# Patient Record
Sex: Male | Born: 1945 | Race: Black or African American | Hispanic: No | Smoking: Former smoker
Health system: Southern US, Community
[De-identification: ages and names within clinical notes are randomized; demographics above are authoritative.]

## PROBLEM LIST (undated history)

## (undated) DIAGNOSIS — J45909 Unspecified asthma, uncomplicated: Secondary | ICD-10-CM

## (undated) DIAGNOSIS — K219 Gastro-esophageal reflux disease without esophagitis: Secondary | ICD-10-CM

## (undated) DIAGNOSIS — I219 Acute myocardial infarction, unspecified: Secondary | ICD-10-CM

## (undated) DIAGNOSIS — I2510 Atherosclerotic heart disease of native coronary artery without angina pectoris: Secondary | ICD-10-CM

## (undated) DIAGNOSIS — E7800 Pure hypercholesterolemia, unspecified: Secondary | ICD-10-CM

## (undated) DIAGNOSIS — I10 Essential (primary) hypertension: Secondary | ICD-10-CM

## (undated) HISTORY — DX: Acute myocardial infarction, unspecified: I21.9

## (undated) HISTORY — DX: Essential (primary) hypertension: I10

## (undated) HISTORY — PX: OTHER SURGICAL HISTORY: SHX169

## (undated) HISTORY — DX: Gastro-esophageal reflux disease without esophagitis: K21.9

## (undated) HISTORY — DX: Pure hypercholesterolemia, unspecified: E78.00

## (undated) HISTORY — PX: BUNIONECTOMY: SHX129

## (undated) HISTORY — PX: HAMMER TOE SURGERY: SHX385

## (undated) HISTORY — PX: CARDIAC CATHETERIZATION: SHX172

---

## 2004-08-10 DIAGNOSIS — I219 Acute myocardial infarction, unspecified: Secondary | ICD-10-CM

## 2004-08-10 HISTORY — PX: CORONARY ARTERY BYPASS GRAFT: SHX141

## 2004-08-10 HISTORY — DX: Acute myocardial infarction, unspecified: I21.9

## 2008-08-10 HISTORY — PX: COLONOSCOPY: SHX174

## 2014-03-10 HISTORY — PX: ESOPHAGOGASTRODUODENOSCOPY: SHX1529

## 2014-03-10 HISTORY — PX: COLONOSCOPY: SHX174

## 2015-04-22 ENCOUNTER — Encounter: Payer: Self-pay | Admitting: Gastroenterology

## 2015-05-08 ENCOUNTER — Ambulatory Visit (INDEPENDENT_AMBULATORY_CARE_PROVIDER_SITE_OTHER): Payer: Medicare FFS | Admitting: Gastroenterology

## 2015-05-08 ENCOUNTER — Encounter: Payer: Self-pay | Admitting: Gastroenterology

## 2015-05-08 VITALS — BP 140/86 | HR 71 | Temp 97.7°F | Ht 70.0 in | Wt 183.0 lb

## 2015-05-08 DIAGNOSIS — R1032 Left lower quadrant pain: Secondary | ICD-10-CM | POA: Insufficient documentation

## 2015-05-08 NOTE — Patient Instructions (Signed)
Let's go ahead and do a CT scan, since you have been dealing with this for so long.   Please have blood work done as well.   Start taking supplemental fiber such as Metamucil or Benefiber daily as directed on bottle. You may add a probiotic daily such as Align, [place], Digestive Advantage, [employer].   We will provide further recommendations after CT is completed.

## 2015-05-08 NOTE — Progress Notes (Signed)
Primary Care Physician:  Talmage Coin, MD Primary Gastroenterologist:  Dr. Oneida Alar   Chief Complaint  Patient presents with  . Diverticulitis    HPI:   Harry Haynes is a 69 y.o. male presenting today at the request of his PCP secondary to concern for diverticulitis. He has had a colonoscopy in 2010 with tubular adenoma and again in 2015 by Dr. West Carbo without "significant diverticulosis". It is unclear if any diverticula were noted on either colonoscopy. He has had several episodes of what was assumed to be diverticulitis and treated empirically but without imaging.   States he was told he had diverticulitis in 2004. Treated recently for assumed diverticulitis. States first episode in 2004 he had eaten spinach salad, developed diarrhea and abdominal pain. Occasional flares every 6-7 months. This year, worse and more frequent. Every 60 days has "flares". Will have a lot of gas, go into diarrhea, feels bloated, stomach starts burning/hurting, pain in left-side, feels weak. Will last for about a week but then goes on medication. Improves symptoms. No fever or chills with episodes. Has been on Cipro and Flagyl in the past but last time was on Augmentin. No CT scans performed. No unexplained weight loss. Within past 6 months has had a decrease in appetite. Right now, on a scale of 2-3 with pain. Can't eat raw veggies, acidic fruit. States he has more burning underlying lower abdomen. No N/V, no GERD exacerbation. Heavy starchy foods will make lower abdomen hurt worse. Diet is mainly fish and chicken. Stays away from red meat. Over last 8-9 months has had more diarrhea/soft stool. Has BM about every 2-4 times per day. Postprandial. Usually Bristol scale # 3. Diarrhea flares up to a #7 during painful episodes. No rectal bleeding. When on antibiotics, stool is dark. Stool feels productive. Occasional bloating. Has underlying lower abdominal discomfort chronically.   Past Medical History  Diagnosis  Date  . Hypertension   . Hypercholesterolemia   . GERD (gastroesophageal reflux disease)   . MI (myocardial infarction) 2006    Past Surgical History  Procedure Laterality Date  . Colonoscopy  Aug 2015    Dr. West Carbo: normal without polyps. No significant diverticulosis  . Esophagogastroduodenoscopy  Aug 2015    Dr. West Carbo: 7cm hiatal hernia, lower esophageal ring s/p 67 Maloney   . Colonoscopy  2010    Dr. Algis Greenhouse: 7-8 mm polyp, tubular adenoma  . Right inguinal hernia  1980s  . 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 Bunionectomy    . Hammer toe surgery      Current Outpatient Prescriptions  Medication Sig Dispense Refill  . aspirin 81 MG tablet Take 81 mg by mouth daily.    Marland Kitchen esomeprazole (NEXIUM) 40 MG capsule Take 40 mg by mouth daily at 12 noon.    . fluticasone (FLOVENT HFA) 110 MCG/ACT inhaler Inhale 2 puffs into the lungs 2 (two) times daily.    . hydrochlorothiazide (HYDRODIURIL) 25 MG tablet Take 25 mg by mouth daily.    Marland Kitchen lisinopril (PRINIVIL,ZESTRIL) 20 MG tablet Take 20 mg by mouth daily.    Marland Kitchen loratadine (CLARITIN) 10 MG tablet Take 10 mg by mouth daily.    . metoprolol tartrate (LOPRESSOR) 12.5 mg TABS tablet Take 12.5 mg by mouth 2 (two) times daily.    . montelukast (SINGULAIR) 10 MG tablet Take 10 mg by mouth at bedtime.    . rosuvastatin (CRESTOR) 20 MG tablet Take 20 mg by  mouth daily.     No current facility-administered medications for this visit.    Allergies as of 05/08/2015  . (No Known Allergies)    Family History  Problem Relation Age of Onset  . Colon cancer Brother     diagnosed at 60, deceased  . Lung cancer Brother     Social History   Social History  . Marital Status: Unknown    Spouse Name: N/A  . Number of Children: N/A  . Years of Education: N/A   Occupational History  . Not on file.   Social History Main Topics  . Smoking status: Former Smoker    Quit date: 05/08/1971  . Smokeless tobacco: Not on  file  . Alcohol Use: No  . Drug Use: No  . Sexual Activity: Not on file   Other Topics Concern  . Not on file   Social History Narrative  . No narrative on file    Review of Systems: As mentioned in HPI   Physical Exam: BP 140/86 mmHg  Pulse 71  Temp(Src) 97.7 F (36.5 C)  Ht 5\' 10"  (1.778 m)  Wt 183 lb (83.008 kg)  BMI 26.26 kg/m2 General:   Alert and oriented. Pleasant and cooperative. Well-nourished and well-developed.  Head:  Normocephalic and atraumatic. Eyes:  Without icterus, sclera clear and conjunctiva pink.  Ears:  Normal auditory acuity. Nose:  No deformity, discharge,  or lesions. Mouth:  No deformity or lesions, oral mucosa pink.  Neck:  Supple, without mass or thyromegaly. Lungs:  Clear to auscultation bilaterally. No wheezes, rales, or rhonchi. No distress.  Heart:  S1, S2 present without murmurs appreciated.  Abdomen:  +BS, soft, very mild discomfort LLQ and non-distended. No HSM noted. No guarding or rebound. No masses appreciated.  Rectal:  Deferred  Msk:  Symmetrical without gross deformities. Normal posture. Extremities:  Without  edema. Neurologic:  Alert and  oriented x4;  grossly normal neurologically. Psych:  Alert and cooperative. Normal mood and affect.  Recent labs July 2016:  Normal lipid profile, Hgb 14.8,

## 2015-05-09 LAB — BASIC METABOLIC PANEL
BUN: 19 mg/dL (ref 7–25)
CHLORIDE: 104 mmol/L (ref 98–110)
CO2: 27 mmol/L (ref 20–31)
Calcium: 9.7 mg/dL (ref 8.6–10.3)
Creat: 1.33 mg/dL — ABNORMAL HIGH (ref 0.70–1.18)
GLUCOSE: 89 mg/dL (ref 65–99)
POTASSIUM: 4.5 mmol/L (ref 3.5–5.3)
SODIUM: 140 mmol/L (ref 135–146)

## 2015-05-09 LAB — TISSUE TRANSGLUTAMINASE, IGA: TISSUE TRANSGLUTAMINASE AB, IGA: 1 U/mL (ref <4)

## 2015-05-10 LAB — IGA: IGA: 166 mg/dL (ref 68–379)

## 2015-05-13 NOTE — Assessment & Plan Note (Signed)
69 year old male treated for presumed diverticulitis on multiple prior occasions, with persisting underlying vague LLQ pain. Appears to have "flare" of pain with associated diarrhea. Notes softer stool over past 8-9 months but not consistent with diarrhea. Occasional bloating. Last colonoscopy fairly recent by Dr. West Carbo in Aug 2015 with "no significant diverticulosis". Prior to this colonoscopy in 2010 with tubular adenoma. No abdominal imaging on file. Symptoms vague, and I'm not convinced he is dealing with repetitive bouts of diverticulitis. Although he has postprandial stool, it is soft and does not appear to be consistent with IBS-D. Will add fiber, check celiac labs, add probiotic, pursue CT now. Further recommendations to follow.

## 2015-05-13 NOTE — Progress Notes (Signed)
cc'ed to pcp °

## 2015-05-20 ENCOUNTER — Ambulatory Visit (HOSPITAL_COMMUNITY)
Admission: RE | Admit: 2015-05-20 | Discharge: 2015-05-20 | Disposition: A | Discharge status: Home / Self Care | Payer: Medicare FFS | Source: Ambulatory Visit | Attending: Gastroenterology | Admitting: Gastroenterology

## 2015-05-20 DIAGNOSIS — M5136 Other intervertebral disc degeneration, lumbar region: Secondary | ICD-10-CM | POA: Insufficient documentation

## 2015-05-20 DIAGNOSIS — R197 Diarrhea, unspecified: Secondary | ICD-10-CM | POA: Diagnosis not present

## 2015-05-20 DIAGNOSIS — R1032 Left lower quadrant pain: Secondary | ICD-10-CM

## 2015-05-20 DIAGNOSIS — N281 Cyst of kidney, acquired: Secondary | ICD-10-CM | POA: Diagnosis not present

## 2015-05-20 MED ORDER — IOHEXOL 300 MG/ML  SOLN
100.0000 mL | Freq: Once | INTRAMUSCULAR | Status: AC | PRN
  Administered 2015-05-20: 100 mL via INTRAVENOUS

## 2015-05-26 NOTE — Progress Notes (Signed)
Quick Note:  CT and labs reviewed. No evidence of diverticulitis or malignancy. Small renal cysts. Prostate is prominent.   1. Labs to include celiac were negative. Cr was mildly elevated at 1.33, but I don't know his baseline. Please send labs to PCP.  2. How is he doing since adding fiber, probiotic? Still with pain? ______

## 2015-05-28 NOTE — Progress Notes (Signed)
Quick Note:  Pt is aware. Michela Pitcher he is doing much better since he is using the fiber and probiotic. His pain has decreased to on a scale of 1-2 now. Routing to Vicente Males to [place] and to Manuela Schwartz to send labs to his PCP. ______

## 2015-06-05 NOTE — Progress Notes (Signed)
Quick Note:  Pt is aware and is scheduled OV with AS on 07/25/2015 at 9:30 AM. ______

## 2015-06-05 NOTE — Progress Notes (Signed)
Quick Note:  Please have patient follow-up with me in about 6-8 weeks. ______

## 2015-06-26 ENCOUNTER — Encounter: Payer: Self-pay | Admitting: Gastroenterology

## 2015-07-25 ENCOUNTER — Ambulatory Visit: Payer: Medicare FFS | Admitting: Gastroenterology

## 2015-07-29 ENCOUNTER — Ambulatory Visit (INDEPENDENT_AMBULATORY_CARE_PROVIDER_SITE_OTHER): Payer: Medicare FFS | Admitting: Gastroenterology

## 2015-07-29 ENCOUNTER — Encounter: Payer: Self-pay | Admitting: Gastroenterology

## 2015-07-29 VITALS — BP 136/79 | HR 58 | Temp 97.5°F | Ht 71.0 in | Wt 186.4 lb

## 2015-07-29 DIAGNOSIS — R1032 Left lower quadrant pain: Secondary | ICD-10-CM

## 2015-07-29 DIAGNOSIS — K219 Gastro-esophageal reflux disease without esophagitis: Secondary | ICD-10-CM | POA: Diagnosis not present

## 2015-07-29 MED ORDER — PANTOPRAZOLE SODIUM 40 MG PO TBEC: 40.0000 mg | DELAYED_RELEASE_TABLET | Freq: Every day | ORAL | Status: DC

## 2015-07-29 NOTE — Assessment & Plan Note (Signed)
Intermittent breakthrough despite Nexium once daily, which he has been on chronically. No alarm features. EGD on file recently at outside facility. Trial Protonix. Call in 10-14 days with progress report; otherwise, return in 3 months.

## 2015-07-29 NOTE — Progress Notes (Signed)
Referring Provider: Talmage Coin, MD Primary Care Physician:  Talmage Coin, MD Primary GI: Dr. Oneida Alar   Chief Complaint  Patient presents with  . Follow-up    HPI:   Harry Haynes is a 69 y.o. male presenting today with a history of abdominal pain and treated empirically for diverticulitis in the past without imaging. Noted lower abdominal burning at last visit, with softer stool and some diarrhea over last 8-9 months. BM usually 2-4 times per day. No rectal bleeding. Remote history of tubular adenoma in 2010 by Dr. West Carbo and most recent colonoscopy 2015 "without significant diverticulosis" per reports. Fiber added at last appt, celiac serologies negative. Probiotic added as well. CT completed without any concerning findings.   Has noted significant improvement with fiber and probiotic. States he had a reaction from the flu shot in Nov 2016. Felt like he had to lay low for a few weeks. Late November his chest started hurting and wouldn't go away. Went to the ED that evening, kept overnight, sent to cardiologist and had stress test and EKG. No significant changes from 2014. States he was told stress may be contributing and started on Zoloft. Notes bloating. No abdominal pain. Stool formation much better. Will have a BM about 3 times a day. Not diarrhea. Well formed. At baseline. States his acid reflux keeps flaring up. Taking generic Nexium daily. No dysphagia. States a small amount of nausea with Zoloft. Taking Zoloft early in the morning. Has only taken Nexium for GERD symptoms.   Can't eat chocolate, coffee, acid types of food. Only complaint now is bloating.   Past Medical History  Diagnosis Date  . Hypertension   . Hypercholesterolemia   . GERD (gastroesophageal reflux disease)   . MI (myocardial infarction) ([place]) 2006    Past Surgical History  Procedure Laterality Date  . Colonoscopy  Aug 2015    Dr. West Carbo: normal without polyps. No significant diverticulosis  .  Esophagogastroduodenoscopy  Aug 2015    Dr. West Carbo: 7cm hiatal hernia, lower esophageal ring s/p 40 Maloney   . Colonoscopy  2010    Dr. Algis Greenhouse: 7-8 mm polyp, tubular adenoma  . Right inguinal hernia  1980s  . 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 Bunionectomy    . Hammer toe surgery      Current Outpatient Prescriptions  Medication Sig Dispense Refill  . aspirin 81 MG tablet Take 81 mg by mouth daily.    Marland Kitchen esomeprazole (NEXIUM) 40 MG capsule Take 40 mg by mouth daily at 12 noon.    . fluticasone (FLOVENT HFA) 110 MCG/ACT inhaler Inhale 2 puffs into the lungs 2 (two) times daily.    . hydrochlorothiazide (HYDRODIURIL) 25 MG tablet Take 25 mg by mouth daily.    Marland Kitchen lisinopril (PRINIVIL,ZESTRIL) 20 MG tablet Take 20 mg by mouth daily.    Marland Kitchen loratadine (CLARITIN) 10 MG tablet Take 10 mg by mouth daily.    . metoprolol tartrate (LOPRESSOR) 12.5 mg TABS tablet Take 12.5 mg by mouth 2 (two) times daily.    . montelukast (SINGULAIR) 10 MG tablet Take 10 mg by mouth at bedtime.    . Probiotic Product (ALIGN PO) Take by mouth daily.    . psyllium (METAMUCIL) 58.6 % powder Take 1 packet by mouth 3 (three) times daily.    . rosuvastatin (CRESTOR) 20 MG tablet Take 20 mg by mouth daily.    . sertraline (ZOLOFT) 100 MG tablet Take  50 mg by mouth daily.     No current facility-administered medications for this visit.    Allergies as of 07/29/2015  . (No Known Allergies)    Family History  Problem Relation Age of Onset  . Colon cancer Brother     diagnosed at 61, deceased  . Lung cancer Brother     Social History   Social History  . Marital Status: Unknown    Spouse Name: N/A  . Number of Children: N/A  . Years of Education: N/A   Social History Main Topics  . Smoking status: Former Smoker    Quit date: 05/08/1971  . Smokeless tobacco: None  . Alcohol Use: No  . Drug Use: No  . Sexual Activity: Not Asked   Other Topics Concern  . None   Social History  Narrative    Review of Systems: As mentioned in HPI.   Physical Exam: BP 136/79 mmHg  Pulse 58  Temp(Src) 97.5 F (36.4 C)  Ht 5\' 11"  (1.803 m)  Wt 186 lb 6.4 oz (84.55 kg)  BMI 26.01 kg/m2 General:   Alert and oriented. No distress noted. Pleasant and cooperative.  Head:  Normocephalic and atraumatic. Eyes:  Conjuctiva clear without scleral icterus. Mouth:  Oral mucosa pink and moist. Good dentition. No lesions. Abdomen:  +BS, soft, non-tender and non-distended. No rebound or guarding. No HSM or masses noted. Msk:  Symmetrical without gross deformities. Normal posture. Extremities:  Without edema. Neurologic:  Alert and  oriented x4;  grossly normal neurologically. Psych:  Alert and cooperative. Normal mood and affect.

## 2015-07-29 NOTE — Patient Instructions (Signed)
Stop Nexium. Start Protonix once each morning, 30 minutes before breakfast.   Continue fiber and probiotic.   Call me in 10-14 days with an update. If you still have bloating, we will proceed with the breath test.   3 months.

## 2015-07-29 NOTE — Assessment & Plan Note (Signed)
CT on file without acute findings. Prominent prostate and renal cysts noted. Upcoming appt with PCP in January 2017. Symptoms improved significantly with addition of probiotic, benefiber. Colonoscopy up-to-date as of last year. Continue with probiotic and benefiber. Vague abdominal bloating noted. Celiac serologies negative. Will trial different PPI as outlined in GERD. If persistent vague bloating, consider hydrogen breath test.

## 2015-07-29 NOTE — Progress Notes (Signed)
cc'ed to pcp °

## 2015-10-28 ENCOUNTER — Ambulatory Visit (INDEPENDENT_AMBULATORY_CARE_PROVIDER_SITE_OTHER): Payer: Medicare FFS | Admitting: Gastroenterology

## 2015-10-28 ENCOUNTER — Encounter: Payer: Self-pay | Admitting: Gastroenterology

## 2015-10-28 ENCOUNTER — Telehealth: Payer: Self-pay | Admitting: Gastroenterology

## 2015-10-28 VITALS — BP 127/75 | HR 68 | Temp 98.3°F | Ht 70.0 in | Wt 194.0 lb

## 2015-10-28 DIAGNOSIS — K219 Gastro-esophageal reflux disease without esophagitis: Secondary | ICD-10-CM | POA: Diagnosis not present

## 2015-10-28 DIAGNOSIS — R1032 Left lower quadrant pain: Secondary | ICD-10-CM | POA: Diagnosis not present

## 2015-10-28 DIAGNOSIS — Z800 Family history of malignant neoplasm of digestive organs: Secondary | ICD-10-CM

## 2015-10-28 NOTE — Assessment & Plan Note (Signed)
Much improved with changing from Nexium to Protonix once daily. Continue this. EGD on file. Return in 6-8 months. No concerning upper GI symptoms.

## 2015-10-28 NOTE — Assessment & Plan Note (Signed)
Surveillance colonoscopy in 2020. Will check ifobt at next visit.

## 2015-10-28 NOTE — Patient Instructions (Signed)
Continue Protonix once daily and Align daily. I have provided coupons for Align.  If you have any concerning symptoms such as recurrent abdominal pain, nausea, vomiting, weight loss, or rectal bleeding, please call me.  I will see you in 6-8 months to make sure you are doing well.  We will check a stool sample at that time. This is purely a screening for occult blood in your stool. If it is positive, we would likely proceed with an early interval colonoscopy due to your history of polyps and family history of colon cancer.   Have a great time until I see you again!

## 2015-10-28 NOTE — Telephone Encounter (Signed)
Doris:   So sorry, I forgot to ask patient: did he see his PCP in Jan 2017? He had a prominent prostate on CT and I wanted to make sure this was addressed.

## 2015-10-28 NOTE — Progress Notes (Signed)
CC'D TO PCP °

## 2015-10-28 NOTE — Progress Notes (Signed)
Referring Provider: Talmage Coin, MD Primary Care Physician:  Talmage Coin, MD  Primary GI: Dr. Oneida Alar   Chief Complaint  Patient presents with  . Follow-up    HPI:   Harry Haynes is a 70 y.o. male presenting today with a history of abdominal pain and treated empirically for diverticulitis in the past without imaging. Noted lower abdominal burning Sept 2016 with softer stool and some diarrhea over last 8-9 months. BM usually 2-4 times per day. No rectal bleeding. Remote history of tubular adenoma in 2010 by Dr. West Carbo and most recent colonoscopy 2015 "without significant diverticulosis" per reports. Fiber added in Sept 2016, celiac serologies negative. Probiotic added as well. CT completed without any concerning findings. Noted significant improvement with fiber and probiotic. Complained of vague bloating at last appt. I had him stop Nexium and start Protonix once each morning, continue fiber and probiotic. Consideration for HBT raised.   No further abdominal discomfort or bloating. No rectal bleeding. Weight is stable/actually increased. No N/V. No constipation or diarrhea. Asthma is going crazy now. States GI symptoms are good but breathing is not as good. Pulmonologist: Dr. Koleen Nimrod.    Past Medical History  Diagnosis Date  . Hypertension   . Hypercholesterolemia   . GERD (gastroesophageal reflux disease)   . MI (myocardial infarction) ([place]) 2006    Past Surgical History  Procedure Laterality Date  . Colonoscopy  Aug 2015    Dr. West Carbo: normal without polyps. No significant diverticulosis  . Esophagogastroduodenoscopy  Aug 2015    Dr. West Carbo: 7cm hiatal hernia, lower esophageal ring s/p 53 Maloney   . Colonoscopy  2010    Dr. Algis Greenhouse: 7-8 mm polyp, tubular adenoma  . Right inguinal hernia  1980s  . 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 Bunionectomy    . Hammer toe surgery      Current Outpatient Prescriptions  Medication Sig Dispense  Refill  . aspirin 81 MG tablet Take 81 mg by mouth daily.    . fluticasone (FLOVENT HFA) 110 MCG/ACT inhaler Inhale 2 puffs into the lungs 2 (two) times daily.    . hydrochlorothiazide (HYDRODIURIL) 25 MG tablet Take 25 mg by mouth daily.    Marland Kitchen lisinopril (PRINIVIL,ZESTRIL) 20 MG tablet Take 20 mg by mouth daily.    . metoprolol tartrate (LOPRESSOR) 12.5 mg TABS tablet Take 12.5 mg by mouth 2 (two) times daily.    . montelukast (SINGULAIR) 10 MG tablet Take 10 mg by mouth at bedtime.    . Probiotic Product (ALIGN PO) Take by mouth daily.    . psyllium (METAMUCIL) 58.6 % powder Take 1 packet by mouth 3 (three) times daily.    . rosuvastatin (CRESTOR) 20 MG tablet Take 20 mg by mouth daily.    . sertraline (ZOLOFT) 100 MG tablet Take 50 mg by mouth daily.    Marland Kitchen esomeprazole (NEXIUM) 40 MG capsule Take 40 mg by mouth daily at 12 noon. Reported on 10/28/2015    . loratadine (CLARITIN) 10 MG tablet Take 10 mg by mouth daily. Reported on 10/28/2015    . pantoprazole (PROTONIX) 40 MG tablet Take 1 tablet (40 mg total) by mouth daily. Take each morning 30 minutes before breakfast. (Patient not taking: Reported on 10/28/2015) 90 tablet 3   No current facility-administered medications for this visit.    Allergies as of 10/28/2015  . (No Known Allergies)    Family History  Problem Relation Age of  Onset  . Colon cancer Brother     diagnosed at 62, deceased  . Lung cancer Brother     Social History   Social History  . Marital Status: Unknown    Spouse Name: N/A  . Number of Children: N/A  . Years of Education: N/A   Social History Main Topics  . Smoking status: Former Smoker    Quit date: 05/08/1971  . Smokeless tobacco: None  . Alcohol Use: No  . Drug Use: No  . Sexual Activity: Not Asked   Other Topics Concern  . None   Social History Narrative    Review of Systems: Negative unless mentioned in HPI   Physical Exam: BP 127/75 mmHg  Pulse 68  Temp(Src) 98.3 F (36.8 C) (Oral)   Ht 5\' 10"  (1.778 m)  Wt 194 lb (87.998 kg)  BMI 27.84 kg/m2 General:   Alert and oriented. No distress noted. Pleasant and cooperative.  Head:  Normocephalic and atraumatic. Eyes:  Conjuctiva clear without scleral icterus. Mouth:  Oral mucosa pink and moist. Good dentition. No lesions. Heart:  S1, S2 present without murmurs Abdomen:  +BS, soft, non-tender and non-distended. No rebound or guarding. No HSM or masses noted. Msk:  Symmetrical without gross deformities. Normal posture. Extremities:  Without edema. Neurologic:  Alert and  oriented x4;  grossly normal neurologically. Psych:  Alert and cooperative. Normal mood and affect.

## 2015-10-28 NOTE — Assessment & Plan Note (Signed)
CT on file without acute findings but prominent prostate noted: this was copied to PCP. He has had complete resolution of abdominal discomfort, bloating, and has normal BMs daily with fiber and Align daily. Colonoscopy up-to-date as of 2015. Will have him return in 6-8 months and check an ifobt then. He has a personal history of colon polyps and positive family history of colon cancer in his brother. No concerning lower GI symptoms at this time and doing quite well.

## 2015-10-28 NOTE — Telephone Encounter (Signed)
PT said he did see his PCP in Jan 2017. Nothing was said about the prostate. He said he has another appt in a couple of months and he wlll mention it to his PCP at that time.

## 2016-04-29 ENCOUNTER — Ambulatory Visit (INDEPENDENT_AMBULATORY_CARE_PROVIDER_SITE_OTHER): Payer: Medicare FFS | Admitting: Gastroenterology

## 2016-04-29 ENCOUNTER — Encounter: Payer: Self-pay | Admitting: Gastroenterology

## 2016-04-29 VITALS — BP 140/89 | HR 74 | Temp 98.0°F | Ht 71.0 in | Wt 187.0 lb

## 2016-04-29 DIAGNOSIS — R1032 Left lower quadrant pain: Secondary | ICD-10-CM | POA: Diagnosis not present

## 2016-04-29 NOTE — Assessment & Plan Note (Signed)
Resolved with taking probiotic and Metamucil daily. CT on file without acute findings. Prominent prostate noted but PSA normal. PCP aware per patient. No concerning lower or upper GI findings. Last colonoscopy in 2015. Personal history of colon polyps and positive family history of colon cancer in brother. Check ifobt now. If positive, proceed with early interval colonoscopy. If negative, return in 1 year.

## 2016-04-29 NOTE — Progress Notes (Signed)
cc'ed to pcp °

## 2016-04-29 NOTE — Progress Notes (Signed)
Referring Provider: Talmage Coin, MD Primary Care Physician:  Talmage Coin, MD  Primary GI: Dr. Oneida Alar   Chief Complaint  Patient presents with  . Follow-up    HPI:   Harry Haynes is a 70 y.o. male presenting today with a history of history of abdominal pain and treated empirically for diverticulitis in the past without imaging. Noted lower abdominal burning Sept 2016 with softer stool and some diarrhea over last 8-9 months. BM usually 2-4 times per day. No rectal bleeding. Remote history of tubular adenoma in 2010 by Dr. West Carbo and most recent colonoscopy 2015 "without significant diverticulosis" per reports. Fiber added in Sept 2016, celiac serologies negative. Probiotic added as well. CT completed without any concerning findings but prominent prostate noted: this was copied to PCP. States his PCP is aware. Outside PSA normal. Noted significant improvement with fiber and probiotic. Complained of vague bloating at last appt. I had him stop Nexium and start Protonix once each morning, continue fiber and probiotic. Consideration for HBT raised. At last appt in March 2017 symptoms were resolved. He was doing well. He is due for routine ifobt now. He has a personal history of colon polyps and positive family history of colon cancer in his brother.   Denies abdominal pain, constipation, diarrhea, rectal bleeding. States he has a good appetite. No issues with GERD. Taking a probiotic and Metamucil. Vague abdominal bloating that is rare and mild. Overall feels really good.   Outside labs brought with patient and listed below.     Past Medical History:  Diagnosis Date  . GERD (gastroesophageal reflux disease)   . Hypercholesterolemia   . Hypertension   . MI (myocardial infarction) ([place]) 2006    Past Surgical History:  Procedure Laterality Date  . BUNIONECTOMY    . COLONOSCOPY  Aug 2015   Dr. West Carbo: normal without polyps. No significant diverticulosis  . COLONOSCOPY  2010     Dr. Algis Greenhouse: 7-8 mm polyp, tubular adenoma  . 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 ESOPHAGOGASTRODUODENOSCOPY  Aug 2015   Dr. West Carbo: 7cm hiatal hernia, lower esophageal ring s/p 31 Maloney   . HAMMER TOE SURGERY    . right inguinal hernia  1980s    Current Outpatient Prescriptions  Medication Sig Dispense Refill  . aspirin 81 MG tablet Take 81 mg by mouth daily.    . carvedilol (COREG) 6.25 MG tablet Take 6.25 mg by mouth 2 (two) times daily with a meal.    . fluticasone (FLOVENT HFA) 110 MCG/ACT inhaler Inhale 2 puffs into the lungs 2 (two) times daily.    . fluticasone furoate-vilanterol (BREO ELLIPTA) 200-25 MCG/INH AEPB Inhale 1 puff into the lungs daily.    . hydrochlorothiazide (HYDRODIURIL) 25 MG tablet Take 25 mg by mouth daily.    Marland Kitchen lisinopril (PRINIVIL,ZESTRIL) 20 MG tablet Take 20 mg by mouth daily.    . montelukast (SINGULAIR) 10 MG tablet Take 10 mg by mouth at bedtime.    . Omega-3 Fatty Acids (OMEGA-3 FISH OIL PO) Take 1,000 mg by mouth daily.    . pantoprazole (PROTONIX) 40 MG tablet Take 1 tablet (40 mg total) by mouth daily. Take each morning 30 minutes before breakfast. 90 tablet 3  . Probiotic Product (ALIGN PO) Take by mouth daily.    . psyllium (METAMUCIL) 58.6 % powder Take 1 packet by mouth 3 (three) times daily.    . rosuvastatin (CRESTOR) 20 MG tablet Take 20  mg by mouth daily.    Marland Kitchen tiotropium (SPIRIVA) 18 MCG inhalation capsule Place 18 mcg into inhaler and inhale daily.     No current facility-administered medications for this visit.     Allergies as of 04/29/2016  . (No Known Allergies)    Family History  Problem Relation Age of Onset  . Colon cancer Brother     diagnosed at 41, deceased  . Lung cancer Brother     Social History   Social History  . Marital status: Divorced    Spouse name: N/A  . Number of children: N/A  . Years of education: N/A   Social History Main Topics  . Smoking status: Former Smoker    Quit  date: 05/08/1971  . Smokeless tobacco: None  . Alcohol use No  . Drug use: No  . Sexual activity: Not Asked   Other Topics Concern  . None   Social History Narrative  . None    Review of Systems: As mentioned in HPI   Physical Exam: BP 140/89   Pulse 74   Temp 98 F (36.7 C) (Oral)   Ht 5' 11"  (1.803 m)   Wt 187 lb (84.8 kg)   BMI 26.08 kg/m  General:   Alert and oriented. No distress noted. Pleasant and cooperative.  Head:  Normocephalic and atraumatic. Eyes:  Conjuctiva clear without scleral icterus. Heart:  S1, S2 present without murmurs, rubs, or gallops.  Abdomen:  +BS, soft, non-tender and non-distended. No rebound or guarding. No HSM or masses noted. Msk:  Symmetrical without gross deformities. Normal posture. Extremities:  Without edema. Neurologic:  Alert and  oriented x4;  grossly normal neurologically. Psych:  Alert and cooperative. Normal mood and affect.   Aug 2017 outside labs:  PSA 2.34, Hgb 15.4, Hct 46.4, Cr 1.39, BUM 18, Albumin 4, T bili 1.3, Direct 0.3, Indirect 1.0, AST 16, ALT 23, Alk Phos 53.

## 2016-04-29 NOTE — Patient Instructions (Signed)
I'm glad you are doing well!  Please complete the stool kit and return to our office. If it is positive, we will proceed with a colonoscopy. If it is negative, we will see you back in 1 year.

## 2016-06-01 ENCOUNTER — Ambulatory Visit (INDEPENDENT_AMBULATORY_CARE_PROVIDER_SITE_OTHER): Payer: Medicare FFS

## 2016-06-01 DIAGNOSIS — R1032 Left lower quadrant pain: Secondary | ICD-10-CM

## 2016-06-01 LAB — IFOBT (OCCULT BLOOD): IMMUNOLOGICAL FECAL OCCULT BLOOD TEST: NEGATIVE

## 2016-06-02 NOTE — Progress Notes (Signed)
LMOM to call.

## 2016-06-02 NOTE — Progress Notes (Signed)
Heme negative. Return in 1 year as planned.

## 2016-06-03 NOTE — Progress Notes (Signed)
Letter mailed to pt with results and to keep follow up as planned in one year.

## 2016-06-30 ENCOUNTER — Other Ambulatory Visit: Payer: Self-pay

## 2016-07-01 MED ORDER — PANTOPRAZOLE SODIUM 40 MG PO TBEC: 40.0000 mg | DELAYED_RELEASE_TABLET | Freq: Every day | ORAL | 5 refills | Status: DC

## 2016-08-18 ENCOUNTER — Telehealth: Payer: Self-pay | Admitting: Gastroenterology

## 2016-08-18 NOTE — Telephone Encounter (Signed)
PATIENT NEEDS 90 DAY PRESCRIPTION OF THE PANTOPRAZOLE SENT TO [facility], [facility] HAS CLOSED

## 2016-08-18 NOTE — Telephone Encounter (Signed)
Forwarding to the refill box.  

## 2016-08-19 MED ORDER — PANTOPRAZOLE SODIUM 40 MG PO TBEC: 40.0000 mg | DELAYED_RELEASE_TABLET | Freq: Every day | ORAL | 3 refills | Status: DC

## 2016-08-19 NOTE — Addendum Note (Signed)
Addended by: Annitta Needs on: 08/19/2016 11:08 AM   Modules accepted: Orders

## 2016-08-19 NOTE — Telephone Encounter (Signed)
Completed.

## 2016-09-15 ENCOUNTER — Telehealth: Payer: Self-pay | Admitting: Gastroenterology

## 2016-09-15 NOTE — Telephone Encounter (Signed)
Pt called asking to speak with AB nurse. He didn't know who his doctor was here. Call transferred to [facility]. He is a [facility] patient

## 2016-09-15 NOTE — Telephone Encounter (Signed)
Pt is aware and has not had any exposure to antibiotics.

## 2016-09-15 NOTE — Telephone Encounter (Signed)
Could have an acute gastroenteritis. Back off to bland foods, drink water to keep urine clear, take kaopectate. If persists despite bland diet or worsening of symptoms, call us. If any exposure to antibiotics recently, check Cdiff.

## 2016-09-15 NOTE — Telephone Encounter (Signed)
I called pt and he said he started having stomach cramps Sunday when he got home from church. He has had diarrhea ( watery stools) every time he would eat anything. Then last night he started having diarrhea every 2-3 hours and it has continued with abdominal cramps. He has taken 8 Imodium and that has not helped. He has had a little nausea, no vomiting and no fever. His usually has normal BM's. Please advise!

## 2017-03-24 ENCOUNTER — Encounter: Payer: Self-pay | Admitting: Gastroenterology

## 2017-06-09 ENCOUNTER — Ambulatory Visit (INDEPENDENT_AMBULATORY_CARE_PROVIDER_SITE_OTHER): Payer: Medicare FFS | Admitting: Gastroenterology

## 2017-06-09 ENCOUNTER — Encounter: Payer: Self-pay | Admitting: Gastroenterology

## 2017-06-09 VITALS — BP 135/82 | HR 68 | Temp 97.8°F | Ht 71.0 in | Wt 174.6 lb

## 2017-06-09 DIAGNOSIS — R1032 Left lower quadrant pain: Secondary | ICD-10-CM

## 2017-06-09 LAB — CBC WITH DIFFERENTIAL/PLATELET
Basophils Absolute: 38 cells/uL (ref 0–200)
Basophils Relative: 0.7 %
EOS ABS: 189 {cells}/uL (ref 15–500)
Eosinophils Relative: 3.5 %
HCT: 43.3 % (ref 38.5–50.0)
Hemoglobin: 14.3 g/dL (ref 13.2–17.1)
Lymphs Abs: 1377 cells/uL (ref 850–3900)
MCH: 28.2 pg (ref 27.0–33.0)
MCHC: 33 g/dL (ref 32.0–36.0)
MCV: 85.4 fL (ref 80.0–100.0)
MONOS PCT: 12.8 %
MPV: 12.4 fL (ref 7.5–12.5)
NEUTROS PCT: 57.5 %
Neutro Abs: 3105 cells/uL (ref 1500–7800)
PLATELETS: 164 10*3/uL (ref 140–400)
RBC: 5.07 10*6/uL (ref 4.20–5.80)
RDW: 12.7 % (ref 11.0–15.0)
TOTAL LYMPHOCYTE: 25.5 %
WBC: 5.4 10*3/uL (ref 3.8–10.8)
WBCMIX: 691 {cells}/uL (ref 200–950)

## 2017-06-09 LAB — COMPLETE METABOLIC PANEL WITH GFR
AG RATIO: 2 (calc) (ref 1.0–2.5)
ALKALINE PHOSPHATASE (APISO): 40 U/L (ref 40–115)
ALT: 31 U/L (ref 9–46)
AST: 24 U/L (ref 10–35)
Albumin: 4.3 g/dL (ref 3.6–5.1)
BILIRUBIN TOTAL: 1.1 mg/dL (ref 0.2–1.2)
BUN/Creatinine Ratio: 11 (calc) (ref 6–22)
BUN: 14 mg/dL (ref 7–25)
CHLORIDE: 108 mmol/L (ref 98–110)
CO2: 28 mmol/L (ref 20–32)
Calcium: 9.4 mg/dL (ref 8.6–10.3)
Creat: 1.25 mg/dL — ABNORMAL HIGH (ref 0.70–1.18)
GFR, Est African American: 66 mL/min/{1.73_m2} (ref >=60)
GFR, Est Non African American: 57 mL/min/{1.73_m2} — ABNORMAL LOW (ref >=60)
GLUCOSE: 104 mg/dL (ref 65–139)
Globulin: 2.1 g/dL (calc) (ref 1.9–3.7)
POTASSIUM: 4.5 mmol/L (ref 3.5–5.3)
Sodium: 144 mmol/L (ref 135–146)
Total Protein: 6.4 g/dL (ref 6.1–8.1)

## 2017-06-09 MED ORDER — CIPROFLOXACIN HCL 500 MG PO TABS: 500.0000 mg | ORAL_TABLET | Freq: Two times a day (BID) | ORAL | 0 refills | Status: DC

## 2017-06-09 MED ORDER — METRONIDAZOLE 500 MG PO TABS: 500.0000 mg | ORAL_TABLET | Freq: Three times a day (TID) | ORAL | 0 refills | Status: DC

## 2017-06-09 NOTE — Patient Instructions (Addendum)
Please have blood work done today.   I have sent in a short course of antibiotics in case you are having a diverticulitis flare, but we also need to do a CT scan in the near future.   Please follow the low fiber diet for now.  I will see you in late December. We will talk about a colonoscopy at that point.   Call if anything worsens!  It was a pleasure to see you today. I strive to create trusting relationships with patients to provide genuine, compassionate, and quality care. I value your feedback. If you receive a survey regarding your visit,  I greatly appreciate you the taking time to fill this out.   Annitta Needs, PhD, ANP-BC [facility]   Low-Fiber Diet Fiber is found in fruits, vegetables, and whole grains. A low-fiber diet restricts fibrous foods that are not digested in the small intestine. A diet containing about 10-15 grams of fiber per day is considered low fiber. Low-fiber diets may be used to:  Promote healing and rest the bowel during intestinal flare-ups.  Prevent blockage of a partially obstructed or narrowed gastrointestinal tract.  Reduce fecal weight and volume.  Slow the movement of feces.  You may be on a low-fiber diet as a transitional diet following surgery, after an injury (trauma), or because of a short (acute) or lifelong (chronic) illness. Your health care provider will determine the length of time you need to stay on this diet. What do I need to know about a low-fiber diet? Always check the fiber content on the packaging's Nutrition Facts label, especially on foods from the grains list. Ask your dietitian if you have questions about specific foods that are related to your condition, especially if the food is not listed below. In general, a low-fiber food will have less than 2 g of fiber. What foods can I eat? Grains All breads and crackers made with white flour. Sweet rolls, doughnuts, waffles, pancakes, [place] toast, bagels. Pretzels,  Melba toast, zwieback. Well-cooked cereals, such as cornmeal, farina, or cream cereals. Dry cereals that do not contain whole grains, fruit, or nuts, such as refined corn, wheat, rice, and oat cereals. Potatoes prepared any way without skins, plain pastas and noodles, refined white rice. Use white flour for baking and making sauces. Use allowed list of grains for casseroles, dumplings, and puddings. Vegetables Strained tomato and vegetable juices. Fresh lettuce, cucumber, spinach. Well-cooked (no skin or pulp) or canned vegetables, such as asparagus, bean sprouts, beets, carrots, green beans, mushrooms, potatoes, pumpkin, spinach, yellow squash, tomato sauce/puree, turnips, yams, and zucchini. Keep servings limited to  cup. Fruits All fruit juices except prune juice. Cooked or canned fruits without skin and seeds, such as applesauce, apricots, cherries, fruit cocktail, grapefruit, grapes, mandarin oranges, melons, peaches, pears, pineapple, and plums. Fresh fruits without skin, such as apricots, avocados, bananas, melons, pineapple, nectarines, and peaches. Keep servings limited to  cup or 1 piece. Meat and Other Protein Sources Ground or well-cooked tender beef, ham, veal, lamb, pork, or poultry. Eggs, plain cheese. Fish, oysters, shrimp, lobster, and other seafood. Liver, organ meats. Smooth nut butters. Dairy All milk products and alternative dairy substitutes, such as soy, rice, almond, and coconut, not containing added whole nuts, seeds, or added fruit. Beverages Decaf coffee, fruit, and vegetable juices or smoothies (small amounts, with no pulp or skins, and with fruits from allowed list), sports drinks, herbal tea. Condiments Ketchup, mustard, vinegar, cream sauce, cheese sauce, cocoa powder. Spices in moderation,  such as allspice, basil, bay leaves, celery powder or leaves, cinnamon, cumin powder, curry powder, ginger, mace, marjoram, onion or garlic powder, oregano, paprika, parsley flakes,  ground pepper, rosemary, sage, savory, tarragon, thyme, and turmeric. Sweets and Desserts Plain cakes and cookies, pie made with allowed fruit, pudding, custard, cream pie. Gelatin, fruit, ice, sherbet, frozen ice pops. Ice cream, ice milk without nuts. Plain hard candy, honey, jelly, molasses, syrup, sugar, chocolate syrup, gumdrops, marshmallows. Limit overall sugar intake. Fats and Oil Margarine, butter, cream, mayonnaise, salad oils, plain salad dressings made from allowed foods. Choose healthy fats such as olive oil, canola oil, and omega-3 fatty acids (such as found in salmon or tuna) when possible. Other Bouillon, broth, or cream soups made from allowed foods. Any strained soup. Casseroles or mixed dishes made with allowed foods. The items listed above may not be a complete list of recommended foods or beverages. Contact your dietitian for more options. What foods are not recommended? Grains All whole wheat and whole grain breads and crackers. Multigrains, rye, bran seeds, nuts, or coconut. Cereals containing whole grains, multigrains, bran, coconut, nuts, raisins. Cooked or dry oatmeal, steel-cut oats. Coarse wheat cereals, granola. Cereals advertised as high fiber. Potato skins. Whole grain pasta, wild or brown rice. Popcorn. Coconut flour. Bran, buckwheat, corn bread, multigrains, rye, wheat germ. Vegetables Fresh, cooked or canned vegetables, such as artichokes, asparagus, beet greens, broccoli, Brussels sprouts, cabbage, celery, cauliflower, corn, eggplant, kale, legumes or beans, okra, peas, and tomatoes. Avoid large servings of any vegetables, especially raw vegetables. Fruits Fresh fruits, such as apples with or without skin, berries, cherries, figs, grapes, grapefruit, guavas, kiwis, mangoes, oranges, papayas, pears, persimmons, pineapple, and pomegranate. Prune juice and juices with pulp, stewed or dried prunes. Dried fruits, dates, raisins. Fruit seeds or skins. Avoid large servings  of all fresh fruits. Meats and Other Protein Sources Tough, fibrous meats with gristle. Chunky nut butter. Cheese made with seeds, nuts, or other foods not recommended. Nuts, seeds, legumes (beans, including baked beans), dried peas, beans, lentils. Dairy Yogurt or cheese that contains nuts, seeds, or added fruit. Beverages Fruit juices with high pulp, prune juice. Caffeinated coffee and teas. Condiments Coconut, maple syrup, pickles, olives. Sweets and Desserts Desserts, cookies, or candies that contain nuts or coconut, chunky peanut butter, dried fruits. Jams, preserves with seeds, marmalade. Large amounts of sugar and sweets. Any other dessert made with fruits from the not recommended list. Other Soups made from vegetables that are not recommended or that contain other foods not recommended. The items listed above may not be a complete list of foods and beverages to avoid. Contact your dietitian for more information. This information is not intended to replace advice given to you by your health care provider. Make sure you discuss any questions you have with your health care provider. Document Released: 01/16/2002 Document Revised: 01/02/2016 Document Reviewed: 06/19/2013 Elsevier Interactive Patient Education  2017 [name].

## 2017-06-09 NOTE — Progress Notes (Signed)
Referring Provider: Talmage Coin, MD Primary Care Physician:  Talmage Coin, MD Primary GI: Dr. Oneida Alar   Chief Complaint  Patient presents with  . Abdominal Pain    f/u. Doing fine. Had not had any pain until 1 week ago  . Gas    HPI:   Harry Haynes is a 71 y.o. male presenting today with a history of abdominal pain and treated empirically for diverticulitis in the past without imaging. Noted lower abdominal burning Sept 2016 with softer stool and some diarrhea over last 8-9 months. BM usually 2-4 times per day. No rectal bleeding. Remote history of tubular adenoma in 2010 by Dr. West Carbo and most recent colonoscopy 2015 "without significant diverticulosis" per reports. CT on file 2016 without concerning findings other than prominent prostate, which PCP is aware. Improvement noted with fiber and probiotic. Personal history of colon polyps and positive family history of colon cancer in brother, diagnosed at age 85. Heme negative last year.   Left-sided burning started one week ago. No diarrhea. No fever or chills. Eating actually helps the discomfort in left side. Mild nausea, no vomiting. Protonix once daily. No dysphagia. No NSAIDs. No rectal bleeding. About 80% vegetarian. Nothing worsens discomfort. Had blood work in July.   Past Medical History:  Diagnosis Date  . GERD (gastroesophageal reflux disease)   . Hypercholesterolemia   . Hypertension   . MI (myocardial infarction) ([place]) 2006    Past Surgical History:  Procedure Laterality Date  . BUNIONECTOMY    . COLONOSCOPY  Aug 2015   Dr. West Carbo: normal without polyps. No significant diverticulosis  . COLONOSCOPY  2010   Dr. Algis Greenhouse: 7-8 mm polyp, tubular adenoma  . 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 ESOPHAGOGASTRODUODENOSCOPY  Aug 2015   Dr. West Carbo: 7cm hiatal hernia, lower esophageal ring s/p 8 Maloney   . HAMMER TOE SURGERY    . right inguinal hernia  1980s    Current Outpatient  Prescriptions  Medication Sig Dispense Refill  . acetaminophen (TYLENOL) 650 MG CR tablet Take 650 mg by mouth 2 (two) times daily.    Marland Kitchen aspirin 81 MG tablet Take 81 mg by mouth daily.    . carvedilol (COREG) 6.25 MG tablet Take 6.25 mg by mouth 2 (two) times daily with a meal.    . fluticasone (FLONASE) 50 MCG/ACT nasal spray Place 1-2 sprays into both nostrils daily.    . fluticasone furoate-vilanterol (BREO ELLIPTA) 100-25 MCG/INH AEPB Inhale 1 puff into the lungs daily.    . hydrochlorothiazide (HYDRODIURIL) 25 MG tablet Take 25 mg by mouth as needed.     Marland Kitchen lisinopril (PRINIVIL,ZESTRIL) 20 MG tablet Take 20 mg by mouth daily.    . montelukast (SINGULAIR) 10 MG tablet Take 10 mg by mouth at bedtime.    . Multiple Vitamin (MULTIVITAMIN) tablet Take 1 tablet by mouth daily.    . Omega-3 Fatty Acids (OMEGA-3 FISH OIL PO) Take 1,000 mg by mouth daily.    . pantoprazole (PROTONIX) 40 MG tablet Take 1 tablet (40 mg total) by mouth daily. Take each morning 30 minutes before breakfast. 90 tablet 3  . Probiotic Product (ALIGN PO) Take by mouth daily.    . psyllium (METAMUCIL) 58.6 % powder Take 1 packet by mouth 2 (two) times daily.     . rosuvastatin (CRESTOR) 20 MG tablet Take 10 mg by mouth daily.     . ticagrelor (BRILINTA) 90 MG TABS tablet  Take 90 mg by mouth 2 (two) times daily.    . fluticasone (FLOVENT HFA) 110 MCG/ACT inhaler Inhale 2 puffs into the lungs 2 (two) times daily.    . fluticasone furoate-vilanterol (BREO ELLIPTA) 200-25 MCG/INH AEPB Inhale 1 puff into the lungs daily.    Marland Kitchen tiotropium (SPIRIVA) 18 MCG inhalation capsule Place 18 mcg into inhaler and inhale daily.     No current facility-administered medications for this visit.     Allergies as of 06/09/2017  . (No Known Allergies)    Family History  Problem Relation Age of Onset  . Colon cancer Brother        diagnosed at 24, deceased  . Lung cancer Brother     Social History   Social History  . Marital status:  Divorced    Spouse name: N/A  . Number of children: N/A  . Years of education: N/A   Social History Main Topics  . Smoking status: Former Smoker    Quit date: 05/08/1971  . Smokeless tobacco: Never Used  . Alcohol use No  . Drug use: No  . Sexual activity: Not on file   Other Topics Concern  . Not on file   Social History Narrative  . No narrative on file    Review of Systems: Gen: Denies fever, chills, anorexia. Denies fatigue, weakness, weight loss.  CV: Denies chest pain, palpitations, syncope, peripheral edema, and claudication. Resp: Denies dyspnea at rest, cough, wheezing, coughing up blood, and pleurisy. GI: see HPI  Derm: Denies rash, itching, dry skin Psych: Denies depression, anxiety, memory loss, confusion. No homicidal or suicidal ideation.  Heme: Denies bruising, bleeding, and enlarged lymph nodes.  Physical Exam: BP 135/82   Pulse 68   Temp 97.8 F (36.6 C) (Oral)   Ht 5\' 11"  (1.803 m)   Wt 174 lb 9.6 oz (79.2 kg)   BMI 24.35 kg/m  General:   Alert and oriented. No distress noted. Pleasant and cooperative.  Head:  Normocephalic and atraumatic. Eyes:  Conjuctiva clear without scleral icterus. Mouth:  Oral mucosa pink and moist.  Abdomen:  +BS, soft, mild to moderate TTP LLQ and non-distended. No rebound or guarding. No HSM or masses noted. Msk:  Symmetrical without gross deformities. Normal posture. Extremities:  Without edema. Neurologic:  Alert and  oriented x4 Psych:  Alert and cooperative. Normal mood and affect.

## 2017-06-10 ENCOUNTER — Telehealth: Payer: Self-pay | Admitting: General Practice

## 2017-06-10 MED ORDER — METRONIDAZOLE 500 MG PO TABS: 500.0000 mg | ORAL_TABLET | Freq: Three times a day (TID) | ORAL | 0 refills | Status: DC

## 2017-06-10 MED ORDER — CIPROFLOXACIN HCL 500 MG PO TABS: 500.0000 mg | ORAL_TABLET | Freq: Two times a day (BID) | ORAL | 0 refills | Status: AC

## 2017-06-10 NOTE — Patient Instructions (Signed)
PA info for CT abd/pelvis w/contrast submitted via [employer] website. Case approved. PA# [id number], 06/11/17-07/11/17.

## 2017-06-10 NOTE — Telephone Encounter (Signed)
Patient was seen by Ab yesterday and she sent in Cipro and Flagyl to his mail order pharmacy, however he would like to send those Rx to [facility], [address] in [place], [place]

## 2017-06-10 NOTE — Telephone Encounter (Signed)
Done. I also cancelled the prescription sent to [facility].

## 2017-06-11 ENCOUNTER — Ambulatory Visit (HOSPITAL_COMMUNITY)
Admission: RE | Admit: 2017-06-11 | Discharge: 2017-06-11 | Disposition: A | Discharge status: Home / Self Care | Payer: Medicare FFS | Source: Ambulatory Visit | Attending: Gastroenterology | Admitting: Gastroenterology

## 2017-06-11 ENCOUNTER — Encounter (HOSPITAL_COMMUNITY): Payer: Self-pay

## 2017-06-11 DIAGNOSIS — R1032 Left lower quadrant pain: Secondary | ICD-10-CM | POA: Insufficient documentation

## 2017-06-11 DIAGNOSIS — N400 Enlarged prostate without lower urinary tract symptoms: Secondary | ICD-10-CM | POA: Diagnosis not present

## 2017-06-11 DIAGNOSIS — I700 Atherosclerosis of aorta: Secondary | ICD-10-CM | POA: Insufficient documentation

## 2017-06-11 MED ORDER — IOPAMIDOL (ISOVUE-300) INJECTION 61%
100.0000 mL | Freq: Once | INTRAVENOUS | Status: AC | PRN
  Administered 2017-06-11: 100 mL via INTRAVENOUS

## 2017-06-11 NOTE — Assessment & Plan Note (Signed)
Recurrent LLQ pain, afebrile, hemodynamically stable. Empiric Cipro and Flagyl provided as he has diverticulosis. Last colonoscopy in 2015 at outside facility. Notes a positive family history of colon cancer in brother, but this was in his 64s. Personal history of polyps. Heme negative last year. I will treat empirically and also obtain updated CT. I feel at this point we should consider early interval colonoscopy when he returns in several weeks, as he has had intermittent LLQ pain that is improved with antibiotic therapy; however, 2016 CT without evidence of acute diverticulitis. Patient is agreeable to this. Return in several weeks to likely arrange colonoscopy. CT ordered as well.

## 2017-06-15 NOTE — Progress Notes (Signed)
CC'D TO PCP °

## 2017-08-09 ENCOUNTER — Encounter: Payer: Self-pay | Admitting: Gastroenterology

## 2017-08-09 ENCOUNTER — Ambulatory Visit: Payer: Medicare FFS | Admitting: Gastroenterology

## 2017-08-09 VITALS — BP 125/82 | HR 76 | Temp 97.1°F | Ht 71.0 in | Wt 175.2 lb

## 2017-08-09 DIAGNOSIS — R1032 Left lower quadrant pain: Secondary | ICD-10-CM | POA: Diagnosis not present

## 2017-08-09 DIAGNOSIS — Z860100 Personal history of colon polyps, unspecified: Secondary | ICD-10-CM | POA: Insufficient documentation

## 2017-08-09 NOTE — Progress Notes (Signed)
CC'D TO PCP °

## 2017-08-09 NOTE — Patient Instructions (Signed)
Continue the Metamucil as you are doing.  We are reaching out to Dr. Sabra Heck in [place] to see if it's ok to stop Brilinta for the colonoscopy or if we need to wait until July 2019. We will let you know when we hear from him!  If it's ok to hold Brilinta now, then we will proceed with a colonoscopy by Dr. Oneida Alar.  Further recommendations to follow!  Have a great New Year!

## 2017-08-09 NOTE — Assessment & Plan Note (Signed)
71 year old male who has been treated empirically for presumed diverticulitis flares over the years, establishing care with [place] in 2016. He was treated empirically in Oct 2018 AND CT scan performed, but this showed no evidence of diverticulitis. Heme negative last year. Last colonoscopy in 2015 by Dr. West Carbo and history of remote adenoma in 2010. He has not had a colonoscopy performed here. Due to recurrent LLQ abdominal pain that is chronic, family history of colon cancer in brother (although it must be noted he was age 66), and personal remote history of polyps, we discussed a colonoscopy as imaging has been unrevealing. However, he had a cardiac stent placed in July 2018 in [place], [place], (Dr. Sabra Heck cardiologist), and he is on [place]. I discussed we may likely need to wait up to a year before holding anticoagulation, but we will ask Dr. Sabra Heck to weigh in on this. As this is an elective procedure and he has no evidence of bleeding or urgency, I anticipate it may be July 2019. Further recommendations to follow. I did discuss the risks and benefits of colonoscopy if able to pursue in the near future. If not, we will see him in June 2019 to arrange.

## 2017-08-09 NOTE — Progress Notes (Signed)
Referring Provider: Talmage Coin, MD Primary Care Physician:  Talmage Coin, MD Primary GI: Dr. Oneida Alar   Chief Complaint  Patient presents with  . Abdominal Pain    f/u, better    HPI:   Harry Haynes is a 71 y.o. male presenting today with a history of abdominal pain and treated empirically for diverticulitis in the past without imaging. Noted lower abdominal burning Sept 2016 with softer stool and some diarrhea over last 8-9 months. BM usually 2-4 times per day. No rectal bleeding. Remote history of tubular adenoma in 2010 by Dr. West Carbo and most recent colonoscopy 2015 "without significant diverticulosis" per reports. CT on file 2016 without concerning findings other than prominent prostate, which PCP is aware. Improvement noted with fiber and probiotic. Personal history of colon polyps and positive family history of colon cancer in brother, diagnosed at age 51. Heme negative last year. He was seen in Oct 2018 with recurrent LLQ burning. Empirically started on antibiotics, with CT ordered. CT showed no evidence of diverticulitis. Plan was for colonoscopy due to intermittent LLQ of unknown etiology.    Pain was improved with antibiotics. No rectal bleeding. No N/V. Started on Brilinta in July 2018 as stent was placed. Dr. Sabra Heck in [place] is cardiologist. Patient thinks he may be able to come off of it after 6 months, which would be soon upcoming. Metamucil twice daily.     Past Medical History:  Diagnosis Date  . GERD (gastroesophageal reflux disease)   . Hypercholesterolemia   . Hypertension   . MI (myocardial infarction) ([place]) 2006    Past Surgical History:  Procedure Laterality Date  . BUNIONECTOMY    . CARDIAC CATHETERIZATION     with stents. started on brilinta   . COLONOSCOPY  Aug 2015   Dr. West Carbo: normal without polyps. No significant diverticulosis  . COLONOSCOPY  2010   Dr. Algis Greenhouse: 7-8 mm polyp, tubular adenoma  . 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 ESOPHAGOGASTRODUODENOSCOPY  Aug 2015   Dr. West Carbo: 7cm hiatal hernia, lower esophageal ring s/p 33 Maloney   . HAMMER TOE SURGERY    . right inguinal hernia  1980s    Current Outpatient Medications  Medication Sig Dispense Refill  . acetaminophen (TYLENOL) 650 MG CR tablet Take 650 mg by mouth 2 (two) times daily.    Marland Kitchen aspirin 81 MG tablet Take 81 mg by mouth daily.    . carvedilol (COREG) 6.25 MG tablet Take 6.25 mg by mouth 2 (two) times daily with a meal.    . fluticasone (FLONASE) 50 MCG/ACT nasal spray Place 1-2 sprays into both nostrils daily.    . fluticasone furoate-vilanterol (BREO ELLIPTA) 100-25 MCG/INH AEPB Inhale 1 puff into the lungs daily.    . hydrochlorothiazide (HYDRODIURIL) 25 MG tablet Take 25 mg by mouth as needed.     Marland Kitchen lisinopril (PRINIVIL,ZESTRIL) 20 MG tablet Take 20 mg by mouth daily.    . montelukast (SINGULAIR) 10 MG tablet Take 10 mg by mouth at bedtime.    . Multiple Vitamin (MULTIVITAMIN) tablet Take 1 tablet by mouth daily.    . Omega-3 Fatty Acids (OMEGA-3 FISH OIL PO) Take 1,000 mg by mouth daily.    . pantoprazole (PROTONIX) 40 MG tablet Take 1 tablet (40 mg total) by mouth daily. Take each morning 30 minutes before breakfast. 90 tablet 3  . Probiotic Product (ALIGN PO) Take by mouth daily.    Marland Kitchen  psyllium (METAMUCIL) 58.6 % powder Take 1 packet by mouth 2 (two) times daily.     . rosuvastatin (CRESTOR) 20 MG tablet Take 10 mg by mouth daily.     . ticagrelor (BRILINTA) 90 MG TABS tablet Take 90 mg by mouth 2 (two) times daily.     No current facility-administered medications for this visit.     Allergies as of 08/09/2017  . (No Known Allergies)    Family History  Problem Relation Age of Onset  . Colon cancer Brother        diagnosed at 33, deceased  . Lung cancer Brother     Social History   Socioeconomic History  . Marital status: Divorced    Spouse name: None  . Number of children: None  . Years of education:  None  . Highest education level: None  Social Needs  . Financial resource strain: None  . Food insecurity - worry: None  . Food insecurity - inability: None  . Transportation needs - medical: None  . Transportation needs - non-medical: None  Occupational History  . None  Tobacco Use  . Smoking status: Former Smoker    Last attempt to quit: 05/08/1971    Years since quitting: 46.2  . Smokeless tobacco: Never Used  Substance and Sexual Activity  . Alcohol use: No    Alcohol/week: 0.0 oz  . Drug use: No  . Sexual activity: None  Other Topics Concern  . None  Social History Narrative  . None    Review of Systems: Gen: Denies fever, chills, anorexia. Denies fatigue, weakness, weight loss.  CV: Denies chest pain, palpitations, syncope, peripheral edema, and claudication. Resp: Denies dyspnea at rest, cough, wheezing, coughing up blood, and pleurisy. GI: see HPI  Derm: Denies rash, itching, dry skin Psych: Denies depression, anxiety, memory loss, confusion. No homicidal or suicidal ideation.  Heme: Denies bruising, bleeding, and enlarged lymph nodes.  Physical Exam: BP 125/82   Pulse 76   Temp (!) 97.1 F (36.2 C) (Oral)   Ht 5\' 11"  (1.803 m)   Wt 175 lb 3.2 oz (79.5 kg)   BMI 24.44 kg/m  General:   Alert and oriented. No distress noted. Pleasant and cooperative.  Head:  Normocephalic and atraumatic. Eyes:  Conjuctiva clear without scleral icterus. Mouth:  Oral mucosa pink and moist. Good dentition. No lesions. Abdomen:  +BS, soft, non-tender and non-distended. No rebound or guarding. No HSM or masses noted. Msk:  Symmetrical without gross deformities. Normal posture. Extremities:  Without edema. Neurologic:  Alert and  oriented x4 Psych:  Alert and cooperative. Normal mood and affect.  Lab Results  Component Value Date   WBC 5.4 06/09/2017   HGB 14.3 06/09/2017   HCT 43.3 06/09/2017   MCV 85.4 06/09/2017   PLT 164 06/09/2017   Lab Results  Component Value Date     ALT 31 06/09/2017   AST 24 06/09/2017   BILITOT 1.1 06/09/2017

## 2017-08-11 ENCOUNTER — Telehealth: Payer: Self-pay

## 2017-08-11 NOTE — Telephone Encounter (Addendum)
I have faxed a letter to Dr. Sabra Heck in reference to holding Brilinta 48 hours prior to procedure or if we need to wait til one year post stent.

## 2017-08-13 NOTE — Telephone Encounter (Signed)
Pt is aware. Forwarding to [place] to schedule.

## 2017-08-13 NOTE — Telephone Encounter (Signed)
Sounds good. Please arrange for him to come back sometime in June or July 2019 for follow-up.

## 2017-08-13 NOTE — Telephone Encounter (Signed)
Letter received back from Dr. Sabra Heck.  Pt unable to stop Brilinta, need to postpone procedure until one year post stent. Forwarding the note to Roseanne Kaufman, NP.

## 2017-08-16 ENCOUNTER — Encounter: Payer: Self-pay | Admitting: Gastroenterology

## 2017-08-16 NOTE — Telephone Encounter (Signed)
PATIENT SCHEDULED  °

## 2017-09-06 ENCOUNTER — Telehealth: Payer: Self-pay | Admitting: Gastroenterology

## 2017-09-06 MED ORDER — PANTOPRAZOLE SODIUM 40 MG PO TBEC: 40.0000 mg | DELAYED_RELEASE_TABLET | Freq: Every day | ORAL | 0 refills | Status: DC

## 2017-09-06 MED ORDER — PANTOPRAZOLE SODIUM 40 MG PO TBEC: 40.0000 mg | DELAYED_RELEASE_TABLET | Freq: Every day | ORAL | 3 refills | Status: DC

## 2017-09-06 NOTE — Telephone Encounter (Signed)
Forwarding to refill box.  

## 2017-09-06 NOTE — Telephone Encounter (Signed)
Completed.

## 2017-09-06 NOTE — Telephone Encounter (Signed)
Pt needs a refill on his pantoprazole. He needs a 90 day supply to [facility] order and a 2 week or 30 day supply to [facility] on [address].

## 2017-10-30 ENCOUNTER — Other Ambulatory Visit: Payer: Self-pay | Admitting: Gastroenterology

## 2018-01-18 ENCOUNTER — Ambulatory Visit: Payer: Medicare FFS | Admitting: Gastroenterology

## 2018-01-18 ENCOUNTER — Encounter: Payer: Self-pay | Admitting: Gastroenterology

## 2018-01-18 VITALS — BP 140/91 | HR 70 | Temp 97.4°F | Ht 71.0 in | Wt 172.6 lb

## 2018-01-18 DIAGNOSIS — Z860100 Personal history of colon polyps, unspecified: Secondary | ICD-10-CM

## 2018-01-18 NOTE — Patient Instructions (Signed)
I am glad you are doing well!  Please call if you have any issues in the meantime. Otherwise, we will see you around March 2020.   Happy early Harry Haynes!!!  It was a pleasure to see you today. I strive to create trusting relationships with patients to provide genuine, compassionate, and quality care. I value your feedback. If you receive a survey regarding your visit,  I greatly appreciate you taking time to fill this out.   Annitta Needs, PhD, ANP-BC [facility]

## 2018-01-18 NOTE — Progress Notes (Signed)
Referring Provider: Talmage Coin, MD Primary Care Physician:  Talmage Coin, MD Primary GI: Dr. Oneida Alar   Chief Complaint  Patient presents with  . Abdominal Pain    f/u, doing ok    HPI:   Harry Haynes is a 72 y.o. male presenting today with a history of LLQ pain, treated empirically prior to presentation to our office for diverticulitis but no imaging was done.  Remote history of tubular adenoma in 2010 by Dr. West Carbo and most recent colonoscopy 2015 "without significant diverticulosis" per reports.CT on file2016without concerning findings other than prominent prostate, which PCP is aware. Improvement noted with fiber and probiotic. Personal history of colon polyps and positive family history of colon cancer in brother, diagnosed at age 30. Heme negative last year. He was seen in Oct 2018 with recurrent LLQ burning. Empirically started on antibiotics, with CT ordered. CT showed no evidence of diverticulitis.   He was started on Brilinta in July 2018 at time of stent placement.   Stress test recently: heart rate irregular. Had another cardiac catheterization last week. Going to tweak medications. Taken off lisinopril and going on entresto soon. Returns to see Dr. Sabra Heck in [place] in 4 weeks.   States his diet was not the best when last seen in Oct 2018. Changed this and avoided foods that triggered pain (seeds, wheat bread, etc) and has done well since.   Past Medical History:  Diagnosis Date  . GERD (gastroesophageal reflux disease)   . Hypercholesterolemia   . Hypertension   . MI (myocardial infarction) ([place]) 2006    Past Surgical History:  Procedure Laterality Date  . BUNIONECTOMY    . CARDIAC CATHETERIZATION     with stents. started on brilinta   . COLONOSCOPY  Aug 2015   Dr. West Carbo: normal without polyps. No significant diverticulosis  . COLONOSCOPY  2010   Dr. Algis Greenhouse: 7-8 mm polyp, tubular adenoma  . CORONARY ARTERY BYPASS GRAFT  2006   triple  vessel   . ESOPHAGOGASTRODUODENOSCOPY  Aug 2015   Dr. West Carbo: 7cm hiatal hernia, lower esophageal ring s/p 84 Maloney   . HAMMER TOE SURGERY    . right inguinal hernia  1980s    Current Outpatient Medications  Medication Sig Dispense Refill  . acetaminophen (TYLENOL) 650 MG CR tablet Take 650 mg by mouth 2 (two) times daily.    Marland Kitchen aspirin 81 MG tablet Take 81 mg by mouth daily.    . carvedilol (COREG) 6.25 MG tablet Take 6.25 mg by mouth 2 (two) times daily with a meal.    . fluticasone (FLONASE) 50 MCG/ACT nasal spray Place 1-2 sprays into both nostrils daily.    . fluticasone furoate-vilanterol (BREO ELLIPTA) 100-25 MCG/INH AEPB Inhale 1 puff into the lungs daily.    . hydrochlorothiazide (HYDRODIURIL) 25 MG tablet Take 25 mg by mouth as needed.     . isosorbide dinitrate (ISORDIL) 10 MG tablet Take 10 mg by mouth 2 (two) times daily.  12  . montelukast (SINGULAIR) 10 MG tablet Take 10 mg by mouth at bedtime.    . Multiple Vitamin (MULTIVITAMIN) tablet Take 1 tablet by mouth daily.    . nitroGLYCERIN (NITROSTAT) 0.4 MG SL tablet Place 0.4 mg under the tongue every 5 (five) minutes as needed for chest pain.    . Omega-3 Fatty Acids (OMEGA-3 FISH OIL PO) Take 1,000 mg by mouth daily.    . pantoprazole (PROTONIX) 40 MG tablet Take 1 tablet (40 mg total)  by mouth daily. Take each morning 30 minutes before breakfast. 90 tablet 3  . Probiotic Product (ALIGN PO) Take by mouth daily.    . psyllium (METAMUCIL) 58.6 % powder Take 1 packet by mouth daily.     . rosuvastatin (CRESTOR) 20 MG tablet Take 10 mg by mouth daily.     . ticagrelor (BRILINTA) 90 MG TABS tablet Take 90 mg by mouth 2 (two) times daily.     No current facility-administered medications for this visit.     Allergies as of 01/18/2018  . (No Known Allergies)    Family History  Problem Relation Age of Onset  . Colon cancer Brother        diagnosed at 23, deceased  . Lung cancer Brother     Social History    Socioeconomic History  . Marital status: Divorced    Spouse name: Not on file  . Number of children: Not on file  . Years of education: Not on file  . Highest education level: Not on file  Occupational History  . Not on file  Social Needs  . Financial resource strain: Not on file  . Food insecurity:    Worry: Not on file    Inability: Not on file  . Transportation needs:    Medical: Not on file    Non-medical: Not on file  Tobacco Use  . Smoking status: Former Smoker    Last attempt to quit: 05/08/1971    Years since quitting: 46.7  . Smokeless tobacco: Never Used  Substance and Sexual Activity  . Alcohol use: No    Alcohol/week: 0.0 oz  . Drug use: No  . Sexual activity: Not on file  Lifestyle  . Physical activity:    Days per week: Not on file    Minutes per session: Not on file  . Stress: Not on file  Relationships  . Social connections:    Talks on phone: Not on file    Gets together: Not on file    Attends religious service: Not on file    Active member of club or organization: Not on file    Attends meetings of clubs or organizations: Not on file    Relationship status: Not on file  Other Topics Concern  . Not on file  Social History Narrative  . Not on file    Review of Systems: Gen: Denies fever, chills, anorexia. Denies fatigue, weakness, weight loss.  CV: Denies chest pain, palpitations, syncope, peripheral edema, and claudication. Resp: Denies dyspnea at rest, cough, wheezing, coughing up blood, and pleurisy. GI: see HPI  Derm: Denies rash, itching, dry skin Psych: Denies depression, anxiety, memory loss, confusion. No homicidal or suicidal ideation.  Heme: Denies bruising, bleeding, and enlarged lymph nodes.  Physical Exam: BP (!) 140/91   Pulse 70   Temp (!) 97.4 F (36.3 C) (Oral)   Ht 5\' 11"  (1.803 m)   Wt 172 lb 9.6 oz (78.3 kg)   BMI 24.07 kg/m  General:   Alert and oriented. No distress noted. Pleasant and cooperative. Appears much  younger than stated age.  Head:  Normocephalic and atraumatic. Eyes:  Conjuctiva clear without scleral icterus. Mouth:  Oral mucosa pink and moist.  Abdomen:  +BS, soft, non-tender and non-distended. No rebound or guarding. No HSM or masses noted. Msk:  Symmetrical without gross deformities. Normal posture. Extremities:  Without edema. Neurologic:  Alert and  oriented x4 Psych:  Alert and cooperative. Normal mood and affect.

## 2018-01-21 NOTE — Assessment & Plan Note (Signed)
72 year old male with history of LLQ pain that has improved with fiber and dietary changes. Heme negative on file last year. CT recently without evidence of diverticulitis. Will need colonoscopy in 2020 due to personal history of polyps and also positive family history of colon cancer in brother (diagnosed at age 53 however). His main issue right now is cardiac-related, s/p stent in July 2018 and on Brilinta. Now with recurrent issues and recent cardiac cath last week per his report. Will have him return in March 2020 to assess readiness to pursue surveillance colonoscopy. In interim, continue high fiber diet, avoidance of foods that exacerbate, and call if concerns in interim.

## 2018-01-24 NOTE — Progress Notes (Signed)
CC'D TO PCP °

## 2018-06-15 ENCOUNTER — Ambulatory Visit: Payer: Medicare FFS | Admitting: Nurse Practitioner

## 2018-06-15 ENCOUNTER — Encounter: Payer: Self-pay | Admitting: Nurse Practitioner

## 2018-06-15 VITALS — BP 128/79 | HR 72 | Temp 97.5°F | Ht 71.0 in | Wt 170.0 lb

## 2018-06-15 DIAGNOSIS — Z800 Family history of malignant neoplasm of digestive organs: Secondary | ICD-10-CM

## 2018-06-15 DIAGNOSIS — K219 Gastro-esophageal reflux disease without esophagitis: Secondary | ICD-10-CM

## 2018-06-15 DIAGNOSIS — R1032 Left lower quadrant pain: Secondary | ICD-10-CM | POA: Diagnosis not present

## 2018-06-15 DIAGNOSIS — Z86010 Personal history of colonic polyps: Secondary | ICD-10-CM

## 2018-06-15 MED ORDER — PANTOPRAZOLE SODIUM 40 MG PO TBEC: 40.0000 mg | DELAYED_RELEASE_TABLET | Freq: Two times a day (BID) | ORAL | 3 refills | Status: DC

## 2018-06-15 NOTE — Patient Instructions (Signed)
1. Increase Protonix to twice daily, 30 minutes before meal. 2. He can continue to use the medication you have on hand.  I will send an updated  I will send in an updated prescription to your pharmacy but asked him to hold it until you call and requested. 3. We will call your cardiologist and clear having your colonoscopy done and whether we can hold Brilinta. 4. Return for follow-up in 4 months. 5. Call us if you have any questions or concerns.  At [facility] we value your feedback. You may receive a survey about your visit today. Please share your experience as we strive to create trusting relationships with our patients to provide genuine, compassionate, quality care.  We appreciate your understanding and patience as we review any laboratory studies, imaging, and other diagnostic tests that are ordered as we care for you. Our office policy is 5 business days for review of these results, and any emergent or urgent results are addressed in a timely manner for your best interest. If you do not hear from our office in 1 week, please contact us.   We also encourage the use of MyChart, which contains your medical information for your review as well. If you are not enrolled in this feature, an access code is on this after visit summary for your convenience. Thank you for allowing [place] to be involved in your care.  It was great to meet you today!  I hope you have a great Fall!!

## 2018-06-15 NOTE — Progress Notes (Signed)
Referring Provider: Talmage Coin, MD Primary Care Physician:  Talmage Coin, MD Primary GI:  Dr. Oneida Alar  Chief Complaint  Patient presents with  . Gastroesophageal Reflux  . Abdominal Pain    abdominal burning    HPI:   Harry Haynes is a 72 y.o. male who presents for follow-up on abdominal pain.  Patient was last seen in our office 01/18/2018 for history of colon polyps.  Noted history of left lower quadrant abdominal pain treated empirically for diverticulitis but without imaging.  Remote history of tubular adenoma in 2010 by Dr. West Carbo and most recent colonoscopy in 2015 without significant diverticulosis.  CT on file 2016 without concerning findings other than prominent prostate.  Family history of colon cancer in his brother diagnosed at age 60.  He was started on Brilinta July 2018 due to stent placement.  Followed by cardiology, another cardiac cath a week prior to his last visit and medications were altered.  Has been avoiding trigger foods.  Recommended follow-up in March 2020 to assess readiness to pursue surveillance colonoscopy and in the interim continue high-fiber diet, avoid exacerbating/trigger substances, call if any concerns.  Today he states he's doing ok o verall. Symptoms started in July of 2018 when he had to fast for his cardiac cath; had GERD exacerbaction when he was fasting. Symptoms include mid-abdominal burning, belching foul material, hoarseness, excessive gas. Has a history of alternating constipation and soft stools. Typically has a hard/constipated stool followed by soft stools. Takes Takes a lot of pepto bismol. Pain seems to improve after a bowel movement. Has a bowel movement about 1-2 a day on Metamucil. No hard stools or straining; overall constipation well managed. Denies hematochezia, melena, fever, chills, unintentional weight loss, N/V. Has lost some weight with dietary changes after heart issues. Stent placed July 2018. Still on Brilenta,  considering coming off of it enough to do TCS in January 2020. Occasional dyspnea with his medications but not significant or lasting. Denies chest pain, dizziness, lightheadedness, syncope, near syncope. Denies any other upper or lower GI symptoms.  Past Medical History:  Diagnosis Date  . GERD (gastroesophageal reflux disease)   . Hypercholesterolemia   . Hypertension   . MI (myocardial infarction) ([place]) 2006    Past Surgical History:  Procedure Laterality Date  . BUNIONECTOMY    . CARDIAC CATHETERIZATION     with stents. started on brilinta   . COLONOSCOPY  Aug 2015   Dr. West Carbo: normal without polyps. No significant diverticulosis  . COLONOSCOPY  2010   Dr. Algis Greenhouse: 7-8 mm polyp, tubular adenoma  . 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 ESOPHAGOGASTRODUODENOSCOPY  Aug 2015   Dr. West Carbo: 7cm hiatal hernia, lower esophageal ring s/p 91 Maloney   . HAMMER TOE SURGERY    . right inguinal hernia  1980s    Current Outpatient Medications  Medication Sig Dispense Refill  . aspirin 81 MG tablet Take 81 mg by mouth daily.    . carvedilol (COREG) 6.25 MG tablet Take 6.25 mg by mouth 2 (two) times daily with a meal.    . fluticasone (FLONASE) 50 MCG/ACT nasal spray Place 1-2 sprays into both nostrils daily.    . fluticasone furoate-vilanterol (BREO ELLIPTA) 100-25 MCG/INH AEPB Inhale 1 puff into the lungs daily.    . isosorbide dinitrate (ISORDIL) 10 MG tablet Take 20 mg by mouth 2 (two) times daily.   12  . montelukast (SINGULAIR) 10 MG  tablet Take 10 mg by mouth at bedtime.    . Multiple Vitamin (MULTIVITAMIN) tablet Take 1 tablet by mouth daily.    . nitroGLYCERIN (NITROSTAT) 0.4 MG SL tablet Place 0.4 mg under the tongue every 5 (five) minutes as needed for chest pain.    . Omega-3 Fatty Acids (OMEGA-3 FISH OIL PO) Take 100 mg by mouth daily.     . pantoprazole (PROTONIX) 40 MG tablet Take 1 tablet (40 mg total) by mouth daily. Take each morning 30 minutes  before breakfast. 90 tablet 3  . Probiotic Product (ALIGN PO) Take by mouth daily.    . psyllium (METAMUCIL) 58.6 % powder Take 1 packet by mouth daily.     . rosuvastatin (CRESTOR) 20 MG tablet Take 20 mg by mouth daily.     . sacubitril-valsartan (ENTRESTO) 49-51 MG Take 1 tablet by mouth 2 (two) times daily.    . ticagrelor (BRILINTA) 90 MG TABS tablet Take 90 mg by mouth 2 (two) times daily.     No current facility-administered medications for this visit.     Allergies as of 06/15/2018  . (No Known Allergies)    Family History  Problem Relation Age of Onset  . Colon cancer Brother        diagnosed at 51, deceased  . Lung cancer Brother     Social History   Socioeconomic History  . Marital status: Divorced    Spouse name: Not on file  . Number of children: Not on file  . Years of education: Not on file  . Highest education level: Not on file  Occupational History  . Not on file  Social Needs  . Financial resource strain: Not on file  . Food insecurity:    Worry: Not on file    Inability: Not on file  . Transportation needs:    Medical: Not on file    Non-medical: Not on file  Tobacco Use  . Smoking status: Former Smoker    Last attempt to quit: 05/08/1971    Years since quitting: 47.1  . Smokeless tobacco: Never Used  Substance and Sexual Activity  . Alcohol use: No    Alcohol/week: 0.0 standard drinks  . Drug use: No  . Sexual activity: Not on file  Lifestyle  . Physical activity:    Days per week: Not on file    Minutes per session: Not on file  . Stress: Not on file  Relationships  . Social connections:    Talks on phone: Not on file    Gets together: Not on file    Attends religious service: Not on file    Active member of club or organization: Not on file    Attends meetings of clubs or organizations: Not on file    Relationship status: Not on file  Other Topics Concern  . Not on file  Social History Narrative  . Not on file    Review of  Systems: Complete ROS negative except as per HPI.   Physical Exam: BP 128/79   Pulse 72   Temp (!) 97.5 F (36.4 C) (Oral)   Ht 5\' 11"  (1.803 m)   Wt 170 lb (77.1 kg)   BMI 23.71 kg/m  General:   Alert and oriented. Pleasant and cooperative. Well-nourished and well-developed.  Eyes:  Without icterus, sclera clear and conjunctiva pink.  Ears:  Normal auditory acuity. Cardiovascular:  S1, S2 present without murmurs appreciated. Extremities without clubbing or edema. Respiratory:  Clear to auscultation  bilaterally. No wheezes, rales, or rhonchi. No distress.  Gastrointestinal:  +BS, soft, non-tender and non-distended. No HSM noted. No guarding or rebound. No masses appreciated.  Rectal:  Deferred  Musculoskalatal:  Symmetrical without gross deformities. Neurologic:  Alert and oriented x4;  grossly normal neurologically. Psych:  Alert and cooperative. Normal mood and affect. Heme/Lymph/Immune: No excessive bruising noted.    06/15/2018 11:26 AM   Disclaimer: This note was dictated with voice recognition software. Similar sounding words can inadvertently be transcribed and may not be corrected upon review.

## 2018-06-16 NOTE — Assessment & Plan Note (Signed)
Patient describes mid abdominal pain and intermittent constipation with abdominal pain generally improving after a bowel movement.  He takes Pepto-Bismol and Metamucil.  He feels that this controls his constipation adequately.  No red flag or warning signs or symptoms such as hematochezia or unintentional weight loss.  At this point recommend he continue his current regimen.  He can call us if he requires further constipation measures.  Otherwise we will follow-up in 4 months to assess his clinical progression.

## 2018-06-16 NOTE — Assessment & Plan Note (Signed)
The patient describes exacerbation of chronic GERD.  This is particularly evident when he was fasting for his cardiac cath.  Symptoms include mid abdominal burning, belching of foul material, hoarseness, excessive gas.  Currently on Protonix daily.  At this point we will increase his Protonix to twice daily temporarily to see if this can get a better handle on his symptoms.  Return for follow-up in 4 months at which point we can consider reducing his Protonix back to once daily.

## 2018-06-16 NOTE — Assessment & Plan Note (Signed)
History of colon cancer in a primary relative with his brother who was diagnosed at age 74 and is now deceased.  His last colonoscopy was in 2015.  Prior to that in 2010 he did have colon polyps status post polypectomy.  Given his family history he would likely not go any longer than 5 years between colonoscopy exams.  He will be coming due in early 2020 for his next exam and we will plan is as per above.  Follow-up in 4 months.

## 2018-06-16 NOTE — Assessment & Plan Note (Signed)
The patient is due for colonoscopy due to history of colon polyps.  He had a cardiac catheterization with stent placement in July 2018 and cardiology recommended waiting until January 2020 to come off his Brilinta temporarily for cardiac cath.  At this point we are nearing the timeframe.  We will plan for colonoscopy after obtaining clearance from cardiology and guidance on holding his Brilinta given his history of colon polyps in order to minimize bleeding risk with decent chance of polypectomy.  We can hold an appointment slot for him and to schedule his procedure when clearance is received from cardiology.  Follow-up in 4 months.  Proceed with colonoscopy with Dr. Oneida Alar in the near future. The risks, benefits, and alternatives have been discussed in detail with the patient. They state understanding and desire to proceed.   The patient is on Brilinta twice daily for status post stent placement in July 2018.  No other anticoagulants, anxiolytics, chronic pain medications, or antidepressants.  Conscious sedation should be adequate for his procedure.

## 2018-06-21 NOTE — Progress Notes (Signed)
cc'ed to pcp °

## 2018-06-24 ENCOUNTER — Telehealth: Payer: Self-pay

## 2018-06-24 NOTE — Telephone Encounter (Signed)
Letter for cardiology clearance was faxed to [facility] on 11//19. Called office and left a detailed message to see if they received letter. Waiting on a return call.

## 2018-07-04 NOTE — Telephone Encounter (Signed)
Harry Haynes, have you received any clearance back? Thanks

## 2018-07-04 NOTE — Telephone Encounter (Signed)
Mindy the office has been contacted a second time. Waiting on a nurse to call back. Letter was faxed to their office on 06/15/18.

## 2018-07-04 NOTE — Telephone Encounter (Signed)
noted 

## 2018-07-05 ENCOUNTER — Other Ambulatory Visit: Payer: Self-pay | Admitting: *Deleted

## 2018-07-05 DIAGNOSIS — Z86010 Personal history of colonic polyps: Secondary | ICD-10-CM

## 2018-07-05 MED ORDER — PEG 3350-KCL-NA BICARB-NACL 420 G PO SOLR: 4000.0000 mL | Freq: Once | ORAL | 0 refills | Status: AC

## 2018-07-05 NOTE — Telephone Encounter (Signed)
Patient aware okay to hold brilenta 2 days prior. He is aware instructions were mailed and prep sent in.

## 2018-07-05 NOTE — Telephone Encounter (Signed)
Received fax from Cardiology in [place] stating okay to hold brilinta 2 days prior to procedure. Do not stop aspirin. Form given to EG.   Called patient LMOVM. Date was already held for 08/19/18 at 1:00pm. Orders entered. Instructions mailed. Prep sent to the pharmacy.

## 2018-07-05 NOTE — Addendum Note (Signed)
Addended by: Inge Rise on: 07/05/2018 08:40 AM   Modules accepted: Orders

## 2018-07-11 NOTE — Telephone Encounter (Signed)
Noted  

## 2018-07-14 ENCOUNTER — Telehealth: Payer: Self-pay | Admitting: *Deleted

## 2018-07-14 NOTE — Telephone Encounter (Signed)
Spoke with patient and made aware [facility] will not be in on 08/19/18 to perform procedure. He was agreeable to have RMR. His new procedure time is 7:30am on 08/19/18. New instructions mailed. Called carolyn in endo and LMOVM making aware of change

## 2018-08-19 ENCOUNTER — Encounter (HOSPITAL_COMMUNITY): Payer: Self-pay

## 2018-08-19 ENCOUNTER — Ambulatory Visit (HOSPITAL_COMMUNITY)
Admission: RE | Admit: 2018-08-19 | Discharge: 2018-08-19 | Disposition: A | Discharge status: Home / Self Care | Payer: Medicare FFS | Attending: Internal Medicine | Admitting: Internal Medicine

## 2018-08-19 ENCOUNTER — Encounter (HOSPITAL_COMMUNITY)
Admission: RE | Disposition: A | Discharge status: Home / Self Care | Payer: Self-pay | Source: Home / Self Care | Attending: Internal Medicine

## 2018-08-19 ENCOUNTER — Other Ambulatory Visit: Payer: Self-pay

## 2018-08-19 DIAGNOSIS — K219 Gastro-esophageal reflux disease without esophagitis: Secondary | ICD-10-CM | POA: Insufficient documentation

## 2018-08-19 DIAGNOSIS — Z1211 Encounter for screening for malignant neoplasm of colon: Secondary | ICD-10-CM | POA: Diagnosis not present

## 2018-08-19 DIAGNOSIS — Z86010 Personal history of colonic polyps: Secondary | ICD-10-CM | POA: Diagnosis not present

## 2018-08-19 DIAGNOSIS — K5730 Diverticulosis of large intestine without perforation or abscess without bleeding: Secondary | ICD-10-CM | POA: Diagnosis not present

## 2018-08-19 DIAGNOSIS — Z87891 Personal history of nicotine dependence: Secondary | ICD-10-CM | POA: Insufficient documentation

## 2018-08-19 DIAGNOSIS — Z79899 Other long term (current) drug therapy: Secondary | ICD-10-CM | POA: Diagnosis not present

## 2018-08-19 DIAGNOSIS — Z800 Family history of malignant neoplasm of digestive organs: Secondary | ICD-10-CM | POA: Diagnosis not present

## 2018-08-19 DIAGNOSIS — I10 Essential (primary) hypertension: Secondary | ICD-10-CM | POA: Diagnosis not present

## 2018-08-19 DIAGNOSIS — Z951 Presence of aortocoronary bypass graft: Secondary | ICD-10-CM | POA: Diagnosis not present

## 2018-08-19 DIAGNOSIS — Z7982 Long term (current) use of aspirin: Secondary | ICD-10-CM | POA: Diagnosis not present

## 2018-08-19 DIAGNOSIS — D122 Benign neoplasm of ascending colon: Secondary | ICD-10-CM | POA: Diagnosis not present

## 2018-08-19 DIAGNOSIS — I252 Old myocardial infarction: Secondary | ICD-10-CM | POA: Diagnosis not present

## 2018-08-19 DIAGNOSIS — E7800 Pure hypercholesterolemia, unspecified: Secondary | ICD-10-CM | POA: Diagnosis not present

## 2018-08-19 HISTORY — PX: POLYPECTOMY: SHX5525

## 2018-08-19 HISTORY — PX: COLONOSCOPY: SHX5424

## 2018-08-19 SURGERY — COLONOSCOPY
Anesthesia: Moderate Sedation

## 2018-08-19 MED ORDER — SODIUM CHLORIDE 0.9 % IV SOLN
INTRAVENOUS | Status: DC
  Administered 2018-08-19: 07:00:00 via INTRAVENOUS

## 2018-08-19 MED ORDER — MIDAZOLAM HCL 5 MG/5ML IJ SOLN
INTRAMUSCULAR | Status: DC | PRN
  Administered 2018-08-19 (×3): 1 mg via INTRAVENOUS

## 2018-08-19 MED ORDER — MIDAZOLAM HCL 5 MG/5ML IJ SOLN
INTRAMUSCULAR | Status: AC
  Filled 2018-08-19: qty 10

## 2018-08-19 MED ORDER — ONDANSETRON HCL 4 MG/2ML IJ SOLN
INTRAMUSCULAR | Status: AC
  Filled 2018-08-19: qty 2

## 2018-08-19 MED ORDER — ONDANSETRON HCL 4 MG/2ML IJ SOLN
INTRAMUSCULAR | Status: DC | PRN
  Administered 2018-08-19: 4 mg via INTRAVENOUS

## 2018-08-19 MED ORDER — MEPERIDINE HCL 100 MG/ML IJ SOLN
INTRAMUSCULAR | Status: DC | PRN
  Administered 2018-08-19: 25 mg via INTRAVENOUS

## 2018-08-19 MED ORDER — STERILE WATER FOR IRRIGATION IR SOLN
Status: DC | PRN
  Administered 2018-08-19: 1.5 mL

## 2018-08-19 MED ORDER — MEPERIDINE HCL 50 MG/ML IJ SOLN
INTRAMUSCULAR | Status: AC
  Filled 2018-08-19: qty 1

## 2018-08-19 NOTE — Discharge Instructions (Signed)
Colonoscopy Discharge Instructions  Read the instructions outlined below and refer to this sheet in the next few weeks. These discharge instructions provide you with general information on caring for yourself after you leave the hospital. Your doctor may also give you specific instructions. While your treatment has been planned according to the most current medical practices available, unavoidable complications occasionally occur. If you have any problems or questions after discharge, call Dr. Gala Romney at [phone]. ACTIVITY  You may resume your regular activity, but move at a slower pace for the next 24 hours.   Take frequent rest periods for the next 24 hours.   Walking will help get rid of the air and reduce the bloated feeling in your belly (abdomen).   No driving for 24 hours (because of the medicine (anesthesia) used during the test).    Do not sign any important legal documents or operate any machinery for 24 hours (because of the anesthesia used during the test).  NUTRITION  Drink plenty of fluids.   You may resume your normal diet as instructed by your doctor.   Begin with a light meal and progress to your normal diet. Heavy or fried foods are harder to digest and may make you feel sick to your stomach (nauseated).   Avoid alcoholic beverages for 24 hours or as instructed.  MEDICATIONS  You may resume your normal medications unless your doctor tells you otherwise.  WHAT YOU CAN EXPECT TODAY  Some feelings of bloating in the abdomen.   Passage of more gas than usual.   Spotting of blood in your stool or on the toilet paper.  IF YOU HAD POLYPS REMOVED DURING THE COLONOSCOPY:  No aspirin products for 7 days or as instructed.   No alcohol for 7 days or as instructed.   Eat a soft diet for the next 24 hours.  FINDING OUT THE RESULTS OF YOUR TEST Not all test results are available during your visit. If your test results are not back during the visit, make an appointment  with your caregiver to find out the results. Do not assume everything is normal if you have not heard from your caregiver or the medical facility. It is important for you to follow up on all of your test results.  SEEK IMMEDIATE MEDICAL ATTENTION IF:  You have more than a spotting of blood in your stool.   Your belly is swollen (abdominal distention).   You are nauseated or vomiting.   You have a temperature over 101.   You have abdominal pain or discomfort that is severe or gets worse throughout the day.    Colon polyp and diverticulosis information provided  (5 polyps removed today)  Further recommendations to follow pending review of pathology report  Resume Brilinta tomorrow   Colon Polyps  Polyps are tissue growths inside the body. Polyps can grow in many places, including the large intestine (colon). A polyp may be a round bump or a mushroom-shaped growth. You could have one polyp or several. Most colon polyps are noncancerous (benign). However, some colon polyps can become cancerous over time. Finding and removing the polyps early can help prevent this. What are the causes? The exact cause of colon polyps is not known. What increases the risk? You are more likely to develop this condition if you:  Have a family history of colon cancer or colon polyps.  Are older than 45 or older than 45 if you are African American.  Have inflammatory bowel disease, such as  ulcerative colitis or Crohn's disease.  Have certain hereditary conditions, such as: ? Familial adenomatous polyposis. ? Lynch syndrome. ? Turcot syndrome. ? Peutz-Jeghers syndrome.  Are overweight.  Smoke cigarettes.  Do not get enough exercise.  Drink too much alcohol.  Eat a diet that is high in fat and red meat and low in fiber.  Had childhood cancer that was treated with abdominal radiation. What are the signs or symptoms? Most polyps do not cause symptoms. If you have symptoms, they may  include:  Blood coming from your rectum when having a bowel movement.  Blood in your stool. The stool may look dark red or black.  Abdominal pain.  A change in bowel habits, such as constipation or diarrhea. How is this diagnosed? This condition is diagnosed with a colonoscopy. This is a procedure in which a lighted, flexible scope is inserted into the anus and then passed into the colon to examine the area. Polyps are sometimes found when a colonoscopy is done as part of routine cancer screening tests. How is this treated? Treatment for this condition involves removing any polyps that are found. Most polyps can be removed during a colonoscopy. Those polyps will then be tested for cancer. Additional treatment may be needed depending on the results of testing. Follow these instructions at home: Lifestyle  Maintain a healthy weight, or lose weight if recommended by your health care provider.  Exercise every day or as told by your health care provider.  Do not use any products that contain nicotine or tobacco, such as cigarettes and e-cigarettes. If you need help quitting, ask your health care provider.  If you drink alcohol, limit how much you have: ? 0-1 drink a day for women. ? 0-2 drinks a day for men.  Be aware of how much alcohol is in your drink. In the [place] one drink equals one 12 oz bottle of beer (355 mL), one 5 oz glass of wine (148 mL), or one 1 oz shot of hard liquor (44 mL). Eating and drinking   Eat foods that are high in fiber, such as fruits, vegetables, and whole grains.  Eat foods that are high in calcium and vitamin D, such as milk, cheese, yogurt, eggs, liver, fish, and broccoli.  Limit foods that are high in fat, such as fried foods and desserts.  Limit the amount of red meat and processed meat you eat, such as hot dogs, sausage, bacon, and lunch meats. General instructions  Keep all follow-up visits as told by your health care provider. This is  important. ? This includes having regularly scheduled colonoscopies. ? Talk to your health care provider about when you need a colonoscopy. Contact a health care provider if:  You have new or worsening bleeding during a bowel movement.  You have new or increased blood in your stool.  You have a change in bowel habits.  You lose weight for no known reason. Summary  Polyps are tissue growths inside the body. Polyps can grow in many places, including the colon.  Most colon polyps are noncancerous (benign), but some can become cancerous over time.  This condition is diagnosed with a colonoscopy.  Treatment for this condition involves removing any polyps that are found. Most polyps can be removed during a colonoscopy. This information is not intended to replace advice given to you by your health care provider. Make sure you discuss any questions you have with your health care provider. Document Released: 04/22/2004 Document Revised: 11/11/2017 Document Reviewed: 11/11/2017  Elsevier Interactive Patient Education  [employer].   Diverticulosis  Diverticulosis is a condition that develops when small pouches (diverticula) form in the wall of the large intestine (colon). The colon is where water is absorbed and stool is formed. The pouches form when the inside layer of the colon pushes through weak spots in the outer layers of the colon. You may have a few pouches or many of them. What are the causes? The cause of this condition is not known. What increases the risk? The following factors may make you more likely to develop this condition:  Being older than age 53. Your risk for this condition increases with age. Diverticulosis is rare among people younger than age 55. By age 26, many people have it.  Eating a low-fiber diet.  Having frequent constipation.  Being overweight.  Not getting enough exercise.  Smoking.  Taking over-the-counter pain medicines, like aspirin and  ibuprofen.  Having a family history of diverticulosis. What are the signs or symptoms? In most people, there are no symptoms of this condition. If you do have symptoms, they may include:  Bloating.  Cramps in the abdomen.  Constipation or diarrhea.  Pain in the lower left side of the abdomen. How is this diagnosed? This condition is most often diagnosed during an exam for other colon problems. Because diverticulosis usually has no symptoms, it often cannot be diagnosed independently. This condition may be diagnosed by:  Using a flexible scope to examine the colon (colonoscopy).  Taking an X-ray of the colon after dye has been put into the colon (barium enema).  Doing a CT scan. How is this treated? You may not need treatment for this condition if you have never developed an infection related to diverticulosis. If you have had an infection before, treatment may include:  Eating a high-fiber diet. This may include eating more fruits, vegetables, and grains.  Taking a fiber supplement.  Taking a live bacteria supplement (probiotic).  Taking medicine to relax your colon.  Taking antibiotic medicines. Follow these instructions at home:  Drink 6-8 glasses of water or more each day to prevent constipation.  Try not to strain when you have a bowel movement.  If you have had an infection before: ? Eat more fiber as directed by your health care provider or your diet and nutrition specialist (dietitian). ? Take a fiber supplement or probiotic, if your health care provider approves.  Take over-the-counter and prescription medicines only as told by your health care provider.  If you were prescribed an antibiotic, take it as told by your health care provider. Do not stop taking the antibiotic even if you start to feel better.  Keep all follow-up visits as told by your health care provider. This is important. Contact a health care provider if:  You have pain in your abdomen.  You  have bloating.  You have cramps.  You have not had a bowel movement in 3 days. Get help right away if:  Your pain gets worse.  Your bloating becomes very bad.  You have a fever or chills, and your symptoms suddenly get worse.  You vomit.  You have bowel movements that are bloody or black.  You have bleeding from your rectum. Summary  Diverticulosis is a condition that develops when small pouches (diverticula) form in the wall of the large intestine (colon).  You may have a few pouches or many of them.  This condition is most often diagnosed during an exam for other  colon problems.  If you have had an infection related to diverticulosis, treatment may include increasing the fiber in your diet, taking supplements, or taking medicines. This information is not intended to replace advice given to you by your health care provider. Make sure you discuss any questions you have with your health care provider. Document Released: 04/23/2004 Document Revised: 06/15/2016 Document Reviewed: 06/15/2016 Elsevier Interactive Patient Education  2019 [name].

## 2018-08-19 NOTE — H&P (Signed)
@LOGO @   Primary Care Physician:  Thea Alken Primary Gastroenterologist:  Dr. Gala Romney  Pre-Procedure History & Physical: HPI:  Harry Haynes is a 73 y.o. male here for surveillance colonoscopy.  History of colonic adenoma.  Positive family history colon cancer in her brother.  No bowel symptoms currently.  Brilinta recently held for procedure.    Past Medical History:  Diagnosis Date  . GERD (gastroesophageal reflux disease)   . Hypercholesterolemia   . Hypertension   . MI (myocardial infarction) ([place]) 2006    Past Surgical History:  Procedure Laterality Date  . BUNIONECTOMY    . CARDIAC CATHETERIZATION     with stents. started on brilinta   . COLONOSCOPY  Aug 2015   Dr. West Carbo: normal without polyps. No significant diverticulosis  . COLONOSCOPY  2010   Dr. Algis Greenhouse: 7-8 mm polyp, tubular adenoma  . 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 ESOPHAGOGASTRODUODENOSCOPY  Aug 2015   Dr. West Carbo: 7cm hiatal hernia, lower esophageal ring s/p 44 [place]   . HAMMER TOE SURGERY    . right inguinal hernia  1980s    Prior to Admission medications   Medication Sig Start Date End Date Taking? Authorizing Provider  acetaminophen (TYLENOL) 650 MG CR tablet Take 650 mg by mouth daily as needed for pain.   Yes [provider]  aspirin 81 MG tablet Take 81 mg by mouth daily.   Yes [provider]  carvedilol (COREG) 6.25 MG tablet Take 6.25 mg by mouth 2 (two) times daily with a meal.   Yes [provider]  fluticasone (FLONASE) 50 MCG/ACT nasal spray Place 1-2 sprays into both nostrils daily as needed for allergies.    Yes [provider]  fluticasone furoate-vilanterol (BREO ELLIPTA) 100-25 MCG/INH AEPB Inhale 1 puff into the lungs daily.   Yes [provider]  isosorbide dinitrate (ISORDIL) 20 MG tablet Take 20 mg by mouth 2 (two) times daily. 06/06/18  Yes [provider]  montelukast (SINGULAIR) 10 MG tablet  Take 10 mg by mouth at bedtime.   Yes [provider]  Omega-3 Fatty Acids (OMEGA-3 FISH OIL PO) Take 100 mg by mouth daily.    Yes [provider]  pantoprazole (PROTONIX) 40 MG tablet Take 1 tablet (40 mg total) by mouth 2 (two) times daily before a meal. 06/15/18  Yes Carlis Stable, NP  Probiotic Product (ALIGN PO) Take 1 capsule by mouth daily.    Yes [provider]  psyllium (METAMUCIL) 58.6 % powder Take 1 packet by mouth daily.    Yes [provider]  rosuvastatin (CRESTOR) 20 MG tablet Take 20 mg by mouth daily.    Yes [provider]  sacubitril-valsartan (ENTRESTO) 49-51 MG Take 1 tablet by mouth 2 (two) times daily.   Yes [provider]  ticagrelor (BRILINTA) 90 MG TABS tablet Take 90 mg by mouth 2 (two) times daily.   Yes [provider]  nitroGLYCERIN (NITROSTAT) 0.4 MG SL tablet Place 0.4 mg under the tongue every 5 (five) minutes as needed for chest pain.    [provider]    Allergies as of 07/05/2018  . (No Known Allergies)    Family History  Problem Relation Age of Onset  . Colon cancer Brother        diagnosed at 67, deceased  . Lung cancer Brother     Social History   Socioeconomic History  . Marital status: Divorced  Spouse name: Not on file  . Number of children: Not on file  . Years of education: Not on file  . Highest education level: Not on file  Occupational History  . Not on file  Social Needs  . Financial resource strain: Not on file  . Food insecurity:    Worry: Not on file    Inability: Not on file  . Transportation needs:    Medical: Not on file    Non-medical: Not on file  Tobacco Use  . Smoking status: Former Smoker    Last attempt to quit: 05/08/1971    Years since quitting: 47.3  . Smokeless tobacco: Never Used  Substance and Sexual Activity  . Alcohol use: Yes    Alcohol/week: 1.0 standard drinks    Types: 1 Glasses of wine per week    Comment: occasional  .  Drug use: No  . Sexual activity: Not on file  Lifestyle  . Physical activity:    Days per week: Not on file    Minutes per session: Not on file  . Stress: Not on file  Relationships  . Social connections:    Talks on phone: Not on file    Gets together: Not on file    Attends religious service: Not on file    Active member of club or organization: Not on file    Attends meetings of clubs or organizations: Not on file    Relationship status: Not on file  . Intimate partner violence:    Fear of current or ex partner: Not on file    Emotionally abused: Not on file    Physically abused: Not on file    Forced sexual activity: Not on file  Other Topics Concern  . Not on file  Social History Narrative  . Not on file    Review of Systems: See HPI, otherwise negative ROS  Physical Exam: BP 140/85   Pulse 61   Temp 97.7 F (36.5 C) (Oral)   Resp 15   Ht 5\' 11"  (1.803 m)   Wt 75.3 kg   SpO2 99%   BMI 23.15 kg/m  General:   Alert,  Well-developed, well-nourished, pleasant and cooperative in NAD Neck:  Supple; no masses or thyromegaly. No significant cervical adenopathy. Lungs:  Clear throughout to auscultation.   No wheezes, crackles, or rhonchi. No acute distress. Heart:  Regular rate and rhythm; no murmurs, clicks, rubs,  or gallops. Abdomen: Non-distended, normal bowel sounds.  Soft and nontender without appreciable mass or hepatosplenomegaly.  Pulses:  Normal pulses noted. Extremities:  Without clubbing or edema.  Impression/Plan: 73 year old gentleman with a history of colonic adenoma-overdue for surveillance.  Positive family history colon cancer first-degree relative.  No bowel symptoms.  Here for a surveillance colonoscopy. The risks, benefits, limitations, alternatives and imponderables have been reviewed with the patient. Questions have been answered. All parties are agreeable.      Notice: This dictation was prepared with Dragon dictation along with smaller phrase  technology. Any transcriptional errors that result from this process are unintentional and may not be corrected upon review.

## 2018-08-19 NOTE — Op Note (Signed)
[facility] Patient Name: Harry Haynes Procedure Date: 08/19/2018 7:26 AM MRN: [id number] Date of Birth: October 04, 1945 Attending MD: Norvel Richards , MD CSN: [id number] Age: 73 Admit Type: Outpatient Procedure:                Colonoscopy Indications:              High risk colon cancer surveillance: Personal                            history of colonic polyps Providers:                Norvel Richards, MD, Charlsie Quest. Theda Sers RN, RN,                            Nelma Rothman, Technician Referring MD:              Medicines:                Midazolam 3 mg IV, Meperidine 25 mg IV Complications:            No immediate complications. Estimated Blood Loss:     Estimated blood loss was minimal. Procedure:                Pre-Anesthesia Assessment:                           - Prior to the procedure, a History and Physical                            was performed, and patient medications and                            allergies were reviewed. The patient's tolerance of                            previous anesthesia was also reviewed. The risks                            and benefits of the procedure and the sedation                            options and risks were discussed with the patient.                            All questions were answered, and informed consent                            was obtained. Prior Anticoagulants: The patient has                            taken no previous anticoagulant or antiplatelet                            agents. ASA Grade Assessment: II - A patient with  mild systemic disease. After reviewing the risks                            and benefits, the patient was deemed in                            satisfactory condition to undergo the procedure.                           After obtaining informed consent, the colonoscope                            was passed under direct vision. Throughout the                             procedure, the patient's blood pressure, pulse, and                            oxygen saturations were monitored continuously. The                            CF-HQ190L ([id number]) scope was introduced through                            the anus and advanced to the the cecum, identified                            by appendiceal orifice and ileocecal valve. The                            colonoscopy was performed without difficulty. The                            patient tolerated the procedure well. The quality                            of the bowel preparation was adequate. The                            ileocecal valve, appendiceal orifice, and rectum                            were photographed. The entire colon was well                            visualized. The patient tolerated the procedure                            well. The quality of the bowel preparation was                            adequate. Scope In: 7:46:24 AM Scope Out: 8:05:11 AM Scope Withdrawal Time: 0 hours 11 minutes 59 seconds  Total Procedure Duration: 0 hours 18 minutes  47 seconds  Findings:      The perianal and digital rectal examinations were normal.      Five sessile polyps were found in the ascending colon. The polyps were 4       to 6 mm in size. These polyps were removed with a cold snare. Resection       and retrieval were complete. Estimated blood loss was minimal.      Multiple small-mouthed diverticula were found in the entire colon.      The exam was otherwise without abnormality on direct and retroflexion       views. Impression:               - Five 4 to 6 mm polyps in the ascending colon,                            removed with a cold snare. Resected and retrieved.                           - Diverticulosis in the entire examined colon.                           - The examination was otherwise normal on direct                            and retroflexion views. Moderate Sedation:      Moderate  (conscious) sedation was administered by the endoscopy nurse       and supervised by the endoscopist. The following parameters were       monitored: oxygen saturation, heart rate, blood pressure, respiratory       rate, EKG, adequacy of pulmonary ventilation, and response to care.       Total physician intraservice time was 25 minutes. Recommendation:           - Patient has a contact number available for                            emergencies. The signs and symptoms of potential                            delayed complications were discussed with the                            patient. Return to normal activities tomorrow.                            Written discharge instructions were provided to the                            patient.                           - Resume previous diet.                           - Repeat colonoscopy date to be determined after  pending pathology results are reviewed for                            surveillance based on pathology results.                           - Return to GI office (date not yet determined).                           Continue Brilinta Procedure Code(s):        --- Professional ---                           [phone], Colonoscopy, flexible; with removal of                            tumor(s), polyp(s), or other lesion(s) by snare                            technique                           99153, Moderate sedation; each additional 15                            minutes intraservice time                           G0500, Moderate sedation services provided by the                            same physician or other qualified health care                            professional performing a gastrointestinal                            endoscopic service that sedation supports,                            requiring the presence of an independent trained                            observer to assist in the monitoring of the                             patient's level of consciousness and physiological                            status; initial 15 minutes of intra-service time;                            patient age 75 years or older (additional time may                            be  reported with [phone], as appropriate) Diagnosis Code(s):        --- Professional ---                           Z86.010, Personal history of colonic polyps                           D12.2, Benign neoplasm of ascending colon                           K57.30, Diverticulosis of large intestine without                            perforation or abscess without bleeding CPT copyright 2018 American Medical Association. All rights reserved. The codes documented in this report are preliminary and upon coder review may  be revised to meet current compliance requirements. Cristopher Estimable. Ahmadou Bolz, MD Norvel Richards, MD 08/19/2018 8:14:31 AM This report has been signed electronically. Number of Addenda: 0

## 2018-08-23 ENCOUNTER — Encounter (HOSPITAL_COMMUNITY): Payer: Self-pay | Admitting: Internal Medicine

## 2018-08-24 ENCOUNTER — Encounter: Payer: Self-pay | Admitting: Internal Medicine

## 2018-10-17 NOTE — Progress Notes (Signed)
Referring Provider: Talmage Coin, MD Primary Care Physician:  Thea Alken Primary GI: Dr. Oneida Alar   Chief Complaint  Patient presents with  . Gastroesophageal Reflux    doing ok    HPI:   Harry Haynes is a 73 y.o. male presenting today with a history of GERD and colon polyps, recently completing colonoscopy with adenomas. Surveillance in 3 years if health permits.   Did not do well with prep recently. Vomiting during half of it. No abdominal pain. Taking Metamucil daily. Taking Protonix BID. No dysphagia. No N/V. Good appetite. Purchased a wedge pillow. Remains active, working out. Wants to get back to using his stationary bike again in the near future.     Past Medical History:  Diagnosis Date  . GERD (gastroesophageal reflux disease)   . Hypercholesterolemia   . Hypertension   . MI (myocardial infarction) ([place]) 2006    Past Surgical History:  Procedure Laterality Date  . BUNIONECTOMY    . CARDIAC CATHETERIZATION     with stents. started on brilinta   . COLONOSCOPY  Aug 2015   Dr. West Carbo: normal without polyps. No significant diverticulosis  . COLONOSCOPY  2010   Dr. Algis Greenhouse: 7-8 mm polyp, tubular adenoma  . COLONOSCOPY N/A 08/19/2018   Dr. Gala Romney: five 4-6 mm polyps in ascending colon s/p resection, diverticulosis, tubular adenomas. Due for surveillance in 3 years  . 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 ESOPHAGOGASTRODUODENOSCOPY  Aug 2015   Dr. West Carbo: 7cm hiatal hernia, lower esophageal ring s/p 6 [place]   . HAMMER TOE SURGERY    . POLYPECTOMY  08/19/2018   Procedure: POLYPECTOMY;  Surgeon: Daneil Dolin, MD;  Location: [facility];  Service: Endoscopy;;  ascending colon(csx5)  . right inguinal hernia  1980s    Current Outpatient Medications  Medication Sig Dispense Refill  . acetaminophen (TYLENOL) 650 MG CR tablet Take 650 mg by mouth daily as needed for pain.    Marland Kitchen aspirin 81 MG tablet Take 81 mg by mouth daily.      . carvedilol (COREG) 6.25 MG tablet Take 6.25 mg by mouth 2 (two) times daily with a meal.    . fluticasone (FLONASE) 50 MCG/ACT nasal spray Place 1-2 sprays into both nostrils daily as needed for allergies.     . fluticasone furoate-vilanterol (BREO ELLIPTA) 100-25 MCG/INH AEPB Inhale 1 puff into the lungs daily.    . isosorbide dinitrate (ISORDIL) 20 MG tablet Take 20 mg by mouth 2 (two) times daily.  0  . montelukast (SINGULAIR) 10 MG tablet Take 10 mg by mouth at bedtime.    . nitroGLYCERIN (NITROSTAT) 0.4 MG SL tablet Place 0.4 mg under the tongue every 5 (five) minutes as needed for chest pain.    . pantoprazole (PROTONIX) 40 MG tablet Take 1 tablet (40 mg total) by mouth 2 (two) times daily before a meal. 60 tablet 3  . Probiotic Product (ALIGN PO) Take 1 capsule by mouth daily.     . psyllium (METAMUCIL) 58.6 % powder Take 1 packet by mouth daily.     . rosuvastatin (CRESTOR) 20 MG tablet Take 20 mg by mouth daily.     . sacubitril-valsartan (ENTRESTO) 49-51 MG Take 1 tablet by mouth 2 (two) times daily.    . ticagrelor (BRILINTA) 90 MG TABS tablet Take 90 mg by mouth 2 (two) times daily.     No current facility-administered medications for this  visit.     Allergies as of 10/19/2018  . (No Known Allergies)    Family History  Problem Relation Age of Onset  . Colon cancer Brother        diagnosed at 70, deceased  . Lung cancer Brother     Social History   Socioeconomic History  . Marital status: Divorced    Spouse name: Not on file  . Number of children: Not on file  . Years of education: Not on file  . Highest education level: Not on file  Occupational History  . Not on file  Social Needs  . Financial resource strain: Not on file  . Food insecurity:    Worry: Not on file    Inability: Not on file  . Transportation needs:    Medical: Not on file    Non-medical: Not on file  Tobacco Use  . Smoking status: Former Smoker    Last attempt to quit: 05/08/1971    Years  since quitting: 47.4  . Smokeless tobacco: Never Used  Substance and Sexual Activity  . Alcohol use: Yes    Alcohol/week: 1.0 standard drinks    Types: 1 Glasses of wine per week    Comment: occasional  . Drug use: No  . Sexual activity: Not on file  Lifestyle  . Physical activity:    Days per week: Not on file    Minutes per session: Not on file  . Stress: Not on file  Relationships  . Social connections:    Talks on phone: Not on file    Gets together: Not on file    Attends religious service: Not on file    Active member of club or organization: Not on file    Attends meetings of clubs or organizations: Not on file    Relationship status: Not on file  Other Topics Concern  . Not on file  Social History Narrative  . Not on file    Review of Systems: Gen: Denies fever, chills, anorexia. Denies fatigue, weakness, weight loss.  CV: Denies chest pain, palpitations, syncope, peripheral edema, and claudication. Resp: Denies dyspnea at rest, cough, wheezing, coughing up blood, and pleurisy. GI: see HPI Derm: Denies rash, itching, dry skin Psych: Denies depression, anxiety, memory loss, confusion. No homicidal or suicidal ideation.  Heme: Denies bruising, bleeding, and enlarged lymph nodes.  Physical Exam: BP 137/83   Pulse (!) 59   Temp (!) 96.9 F (36.1 C) (Oral)   Ht 5\' 11"  (1.803 m)   Wt 174 lb 9.6 oz (79.2 kg)   BMI 24.35 kg/m  General:   Alert and oriented. No distress noted. Pleasant and cooperative.  Head:  Normocephalic and atraumatic. Eyes:  Conjuctiva clear without scleral icterus. Mouth:  Oral mucosa pink and moist. Good dentition. No lesions. Abdomen:  +BS, soft, non-tender and non-distended. No rebound or guarding. No HSM or masses noted. Msk:  Symmetrical without gross deformities. Normal posture. Extremities:  Without edema. Neurologic:  Alert and  oriented x4 Psych:  Alert and cooperative. Normal mood and affect.

## 2018-10-19 ENCOUNTER — Other Ambulatory Visit: Payer: Self-pay

## 2018-10-19 ENCOUNTER — Ambulatory Visit: Payer: Medicare FFS | Admitting: Gastroenterology

## 2018-10-19 ENCOUNTER — Encounter: Payer: Self-pay | Admitting: Gastroenterology

## 2018-10-19 VITALS — BP 137/83 | HR 59 | Temp 96.9°F | Ht 71.0 in | Wt 174.6 lb

## 2018-10-19 DIAGNOSIS — K219 Gastro-esophageal reflux disease without esophagitis: Secondary | ICD-10-CM | POA: Diagnosis not present

## 2018-10-19 NOTE — Assessment & Plan Note (Signed)
Very pleasant 73 year old male with chronic GERD and no alarm signs/symptoms, doing well with Protonix. Previous visit had been increased to BID, but I feel he would do well to resume once daily dosing. We discussed this, and he is eager to resume lowest dose that is most effective. We will see him back in 1 year or sooner if needed. Next colonoscopy in Jan 2023 if health permits.

## 2018-10-19 NOTE — Progress Notes (Signed)
CC'ED TO PCP 

## 2018-10-19 NOTE — Patient Instructions (Signed)
Let's decrease Protonix to once each morning, 30 minutes before breakfast. On days you need twice a day, you can take the evening dose 30 minutes before dinner. Hopefully, we can wean you down to just once a day. Call me if any issues!  We will see you back in 1 year or sooner if needed!  Happy Birthday in June!!!!    I enjoyed seeing you again today! As you know, I value our relationship and want to provide genuine, compassionate, and quality care. I welcome your feedback. If you receive a survey regarding your visit,  I greatly appreciate you taking time to fill this out. See you next time!  Annitta Needs, PhD, ANP-BC [facility]

## 2018-10-22 IMAGING — CT CT ABD-PELV W/ CM
2 of 5 series · 17 of 46 positions shown, 19 images · IV contrast (Isovue)
Comparison: 05/20/2015 CT

CLINICAL DATA: 71-year-old male with acute on chronic abdominal and
pelvic pain.

EXAM:
CT ABDOMEN AND PELVIS WITH CONTRAST
TECHNIQUE: Multidetector CT imaging of the abdomen and pelvis was performed
using the standard protocol following bolus administration of
intravenous contrast.
CONTRAST:  100mL NFNYIU-ROO IOPAMIDOL (NFNYIU-ROO) INJECTION 61%

[Series 2: axial st · axial · 0.80mm/px · z∈[+947,+1367]mm · 14 of 94 slices shown, 16 images]
[im 5/94  soft-tissue]
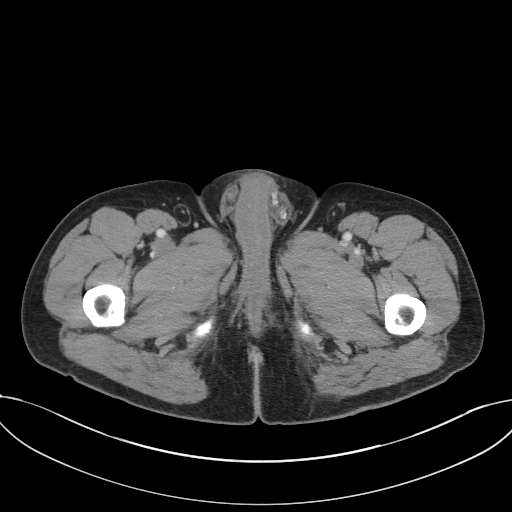
[im 5/94  bone]
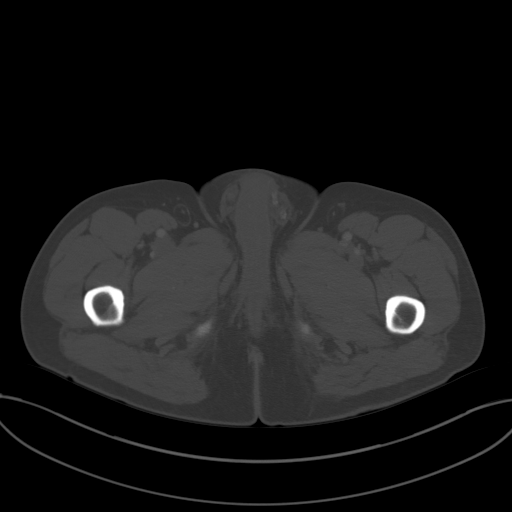
[im 10/94  soft-tissue]
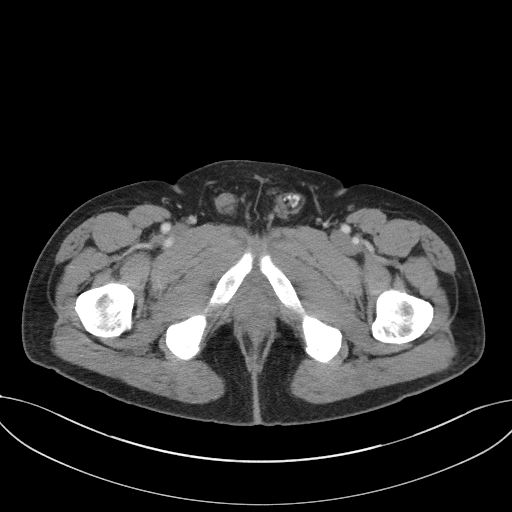
[im 20/94  soft-tissue]
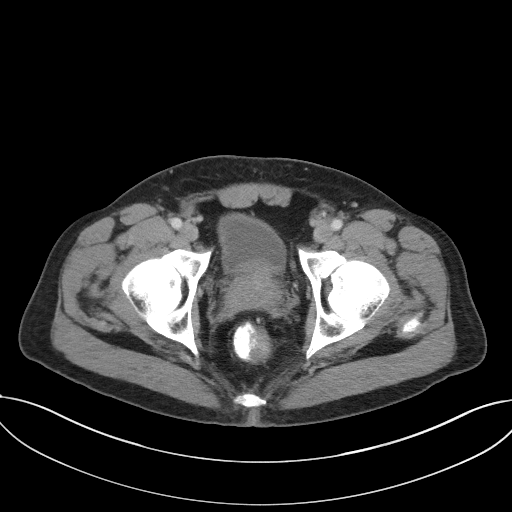
[im 25/94  soft-tissue]
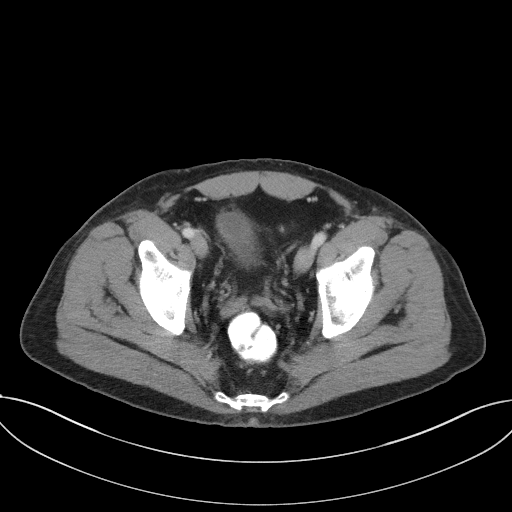
[im 30/94  soft-tissue]
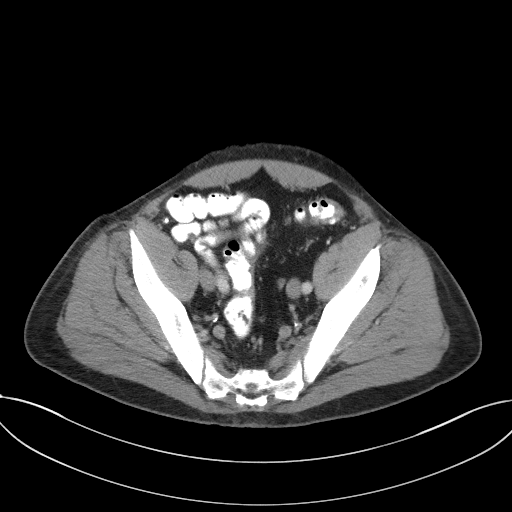
[im 40/94  soft-tissue]
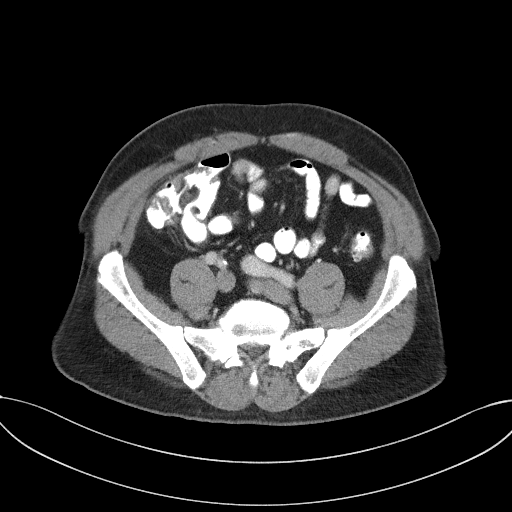
[im 45/94  soft-tissue]
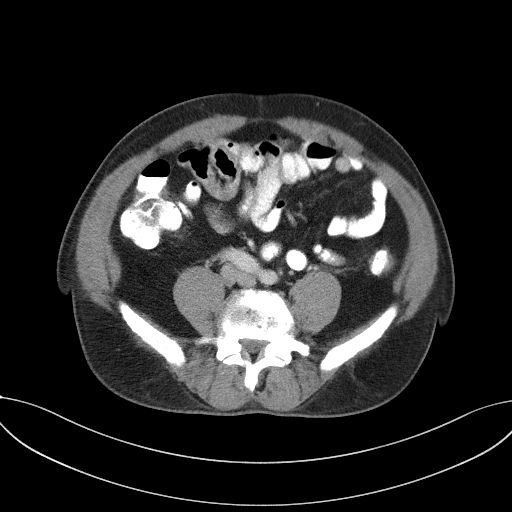
[im 49/94  soft-tissue]
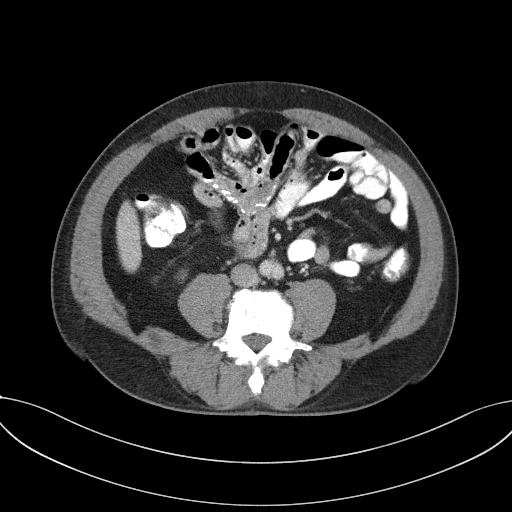
[im 54/94  soft-tissue]
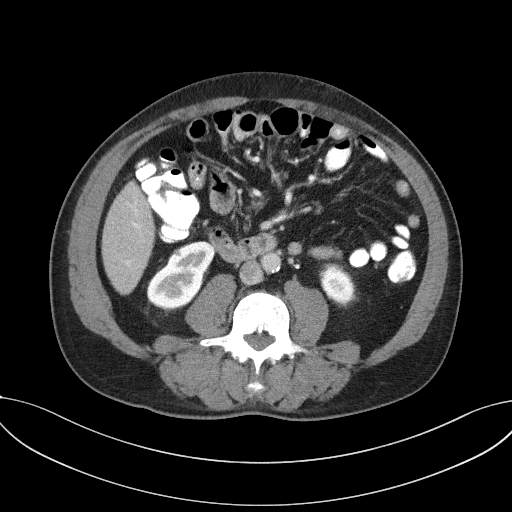
[im 54/94  bone]
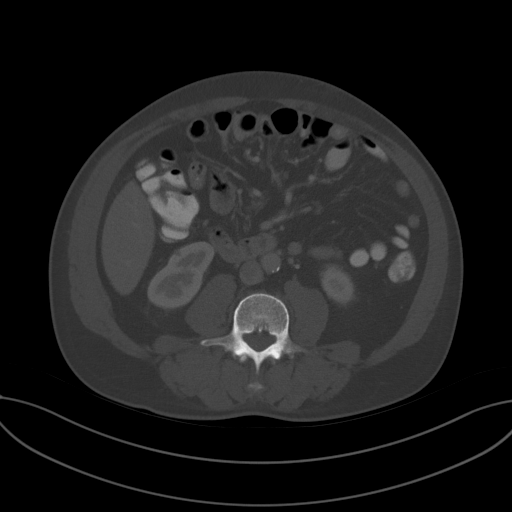
[im 64/94  soft-tissue]
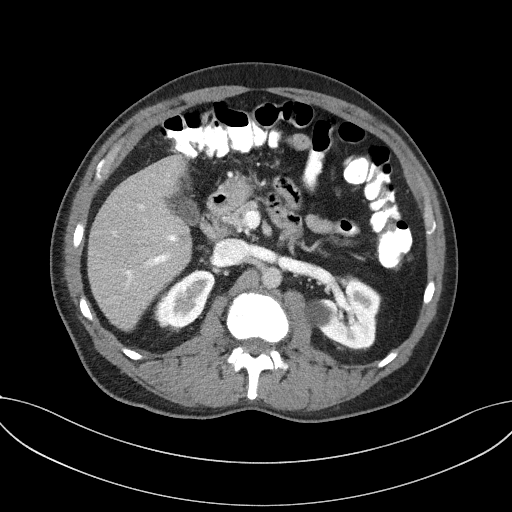
[im 69/94  soft-tissue]
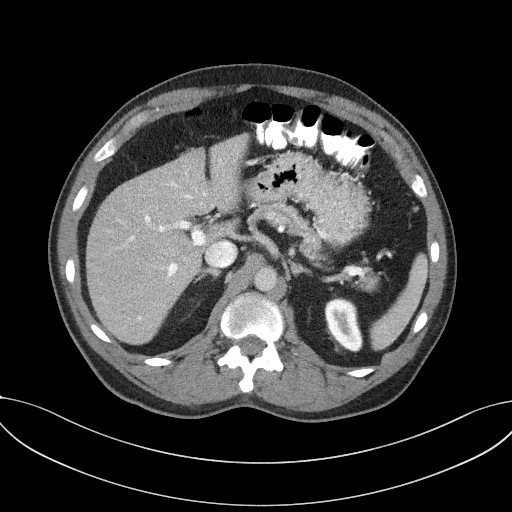
[im 74/94  soft-tissue]
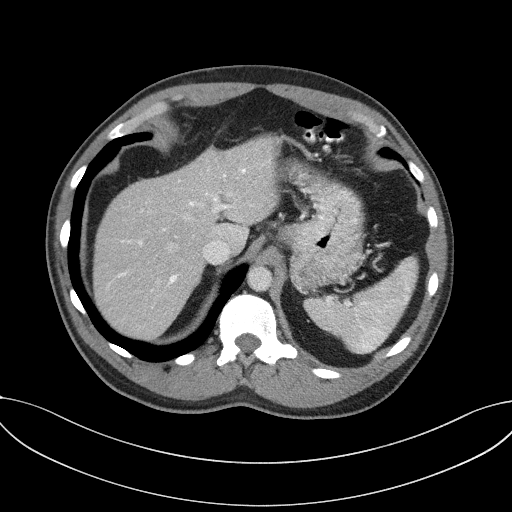
[im 84/94  soft-tissue]
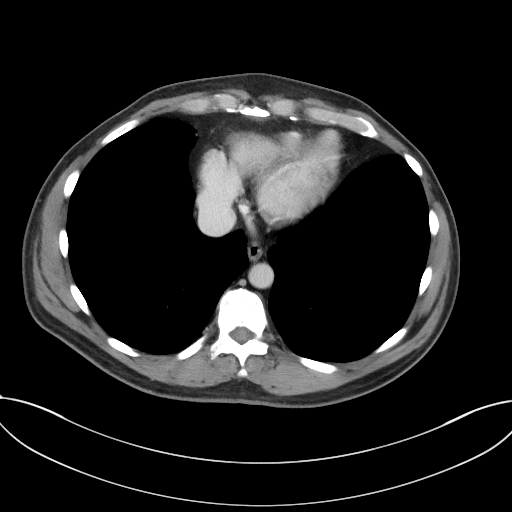
[im 89/94  soft-tissue]
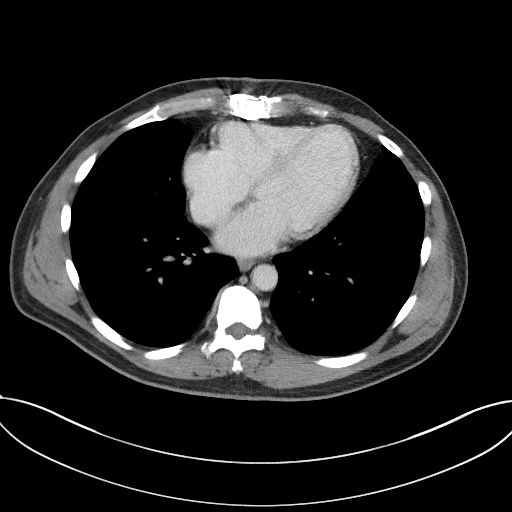

[Series 4: coronal st · coronal · 0.81mm/px · 3 of 90 slices shown]
[im 30/90  soft-tissue]
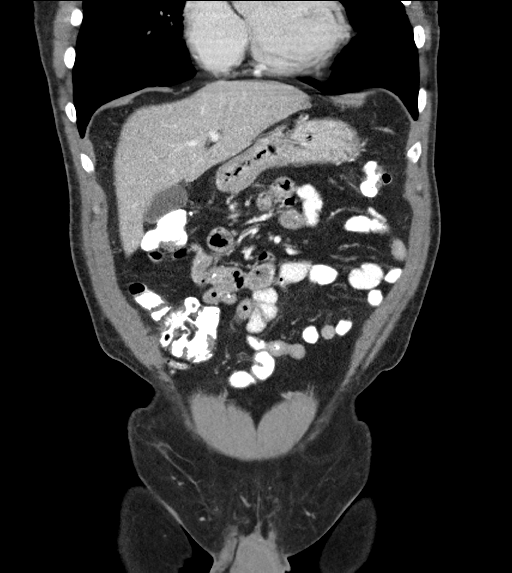
[im 40/90  soft-tissue]
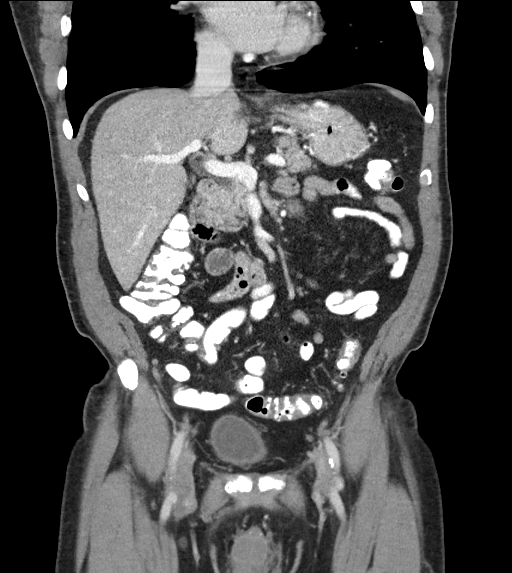
[im 50/90  soft-tissue]
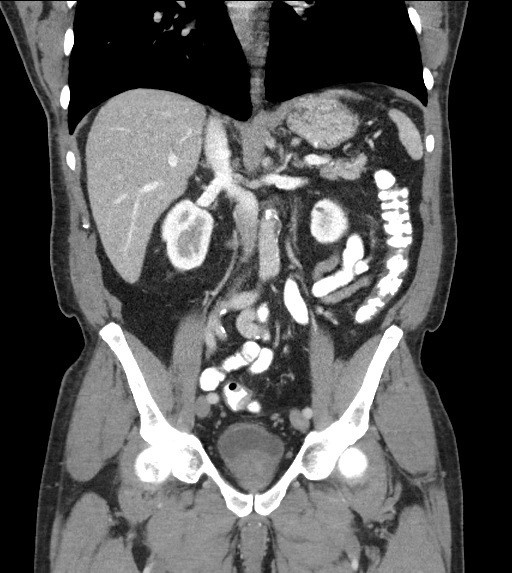

[17 of 46 positions shown; findings below may reference images not displayed]

FINDINGS: Lower chest: No acute abnormality.

Hepatobiliary: The liver and gallbladder are unremarkable. No
biliary dilatation.

Pancreas: Unremarkable

Spleen: Unremarkable

Adrenals/Urinary Tract: The kidneys, adrenal glands and bladder are
unremarkable except for small renal cysts.

Stomach/Bowel: Stomach is within normal limits. Appendix appears
normal. No evidence of bowel wall thickening, distention, or
inflammatory changes. Mild descending/sigmoid colonic diverticulosis
noted without diverticulitis.

Vascular/Lymphatic: Aortic atherosclerosis. No enlarged abdominal or
pelvic lymph nodes.

Reproductive: Prostate enlargement noted.

Other: No free fluid, focal collection or pneumoperitoneum.

Musculoskeletal: No acute or significant osseous findings. Mild
degenerative changes within the lumbar spine noted.
IMPRESSION: 1. No abnormalities identified to suggest a cause for this patient's
abdominal pain.
2. No acute abnormalities
3.  Aortic Atherosclerosis ([id number]).

## 2018-11-02 ENCOUNTER — Other Ambulatory Visit: Payer: Self-pay | Admitting: *Deleted

## 2018-11-02 DIAGNOSIS — Z800 Family history of malignant neoplasm of digestive organs: Secondary | ICD-10-CM

## 2018-11-02 DIAGNOSIS — K219 Gastro-esophageal reflux disease without esophagitis: Secondary | ICD-10-CM

## 2018-11-02 DIAGNOSIS — R1032 Left lower quadrant pain: Secondary | ICD-10-CM

## 2018-11-02 DIAGNOSIS — Z86010 Personal history of colonic polyps: Secondary | ICD-10-CM

## 2018-11-02 MED ORDER — PANTOPRAZOLE SODIUM 40 MG PO TBEC: DELAYED_RELEASE_TABLET | ORAL | 5 refills | Status: DC

## 2018-11-07 ENCOUNTER — Telehealth: Payer: Self-pay

## 2018-11-07 DIAGNOSIS — R1032 Left lower quadrant pain: Secondary | ICD-10-CM

## 2018-11-07 DIAGNOSIS — Z860100 Personal history of colon polyps, unspecified: Secondary | ICD-10-CM

## 2018-11-07 DIAGNOSIS — Z800 Family history of malignant neoplasm of digestive organs: Secondary | ICD-10-CM

## 2018-11-07 DIAGNOSIS — K219 Gastro-esophageal reflux disease without esophagitis: Secondary | ICD-10-CM

## 2018-11-07 MED ORDER — PANTOPRAZOLE SODIUM 40 MG PO TBEC: DELAYED_RELEASE_TABLET | ORAL | 5 refills | Status: DC

## 2018-11-07 NOTE — Addendum Note (Signed)
Addended by: Annitta Needs on: 11/07/2018 03:15 PM   Modules accepted: Orders

## 2018-11-07 NOTE — Telephone Encounter (Signed)
Completed and sent to [facility].

## 2018-11-07 NOTE — Telephone Encounter (Signed)
[facility] pharmacy refill request received for Pantoprazole 40 mg one capsule po twice daily. Phone [phone], Fax [phone].

## 2019-07-16 NOTE — Progress Notes (Signed)
REVIEWED-NO ADDITIONAL RECOMMENDATIONS. 

## 2019-09-04 ENCOUNTER — Other Ambulatory Visit: Payer: Self-pay

## 2019-09-04 ENCOUNTER — Ambulatory Visit: Payer: Medicare FFS | Admitting: Gastroenterology

## 2019-09-04 ENCOUNTER — Encounter: Payer: Self-pay | Admitting: Gastroenterology

## 2019-09-04 VITALS — BP 132/86 | HR 77 | Temp 96.9°F | Ht 71.0 in | Wt 175.0 lb

## 2019-09-04 DIAGNOSIS — R140 Abdominal distension (gaseous): Secondary | ICD-10-CM | POA: Diagnosis not present

## 2019-09-04 DIAGNOSIS — K219 Gastro-esophageal reflux disease without esophagitis: Secondary | ICD-10-CM | POA: Diagnosis not present

## 2019-09-04 DIAGNOSIS — R1013 Epigastric pain: Secondary | ICD-10-CM | POA: Diagnosis not present

## 2019-09-04 LAB — CBC WITH DIFFERENTIAL/PLATELET
Absolute Monocytes: 556 cells/uL (ref 200–950)
Basophils Absolute: 51 cells/uL (ref 0–200)
Basophils Relative: 1 %
Eosinophils Absolute: 270 cells/uL (ref 15–500)
Eosinophils Relative: 5.3 %
HCT: 47 % (ref 38.5–50.0)
Hemoglobin: 15.3 g/dL (ref 13.2–17.1)
Lymphs Abs: 1097 cells/uL (ref 850–3900)
MCH: 28.5 pg (ref 27.0–33.0)
MCHC: 32.6 g/dL (ref 32.0–36.0)
MCV: 87.5 fL (ref 80.0–100.0)
MPV: 12.5 fL (ref 7.5–12.5)
Monocytes Relative: 10.9 %
Neutro Abs: 3126 cells/uL (ref 1500–7800)
Neutrophils Relative %: 61.3 %
Platelets: 163 10*3/uL (ref 140–400)
RBC: 5.37 10*6/uL (ref 4.20–5.80)
RDW: 13 % (ref 11.0–15.0)
Total Lymphocyte: 21.5 %
WBC: 5.1 10*3/uL (ref 3.8–10.8)

## 2019-09-04 LAB — LIPASE: Lipase: 27 U/L (ref 7–60)

## 2019-09-04 LAB — COMPREHENSIVE METABOLIC PANEL
AG Ratio: 1.9 (calc) (ref 1.0–2.5)
ALT: 15 U/L (ref 9–46)
AST: 17 U/L (ref 10–35)
Albumin: 4.3 g/dL (ref 3.6–5.1)
Alkaline phosphatase (APISO): 38 U/L (ref 35–144)
BUN/Creatinine Ratio: 13 (calc) (ref 6–22)
BUN: 17 mg/dL (ref 7–25)
CO2: 29 mmol/L (ref 20–32)
Calcium: 9.3 mg/dL (ref 8.6–10.3)
Chloride: 104 mmol/L (ref 98–110)
Creat: 1.33 mg/dL — ABNORMAL HIGH (ref 0.70–1.18)
Globulin: 2.3 g/dL (calc) (ref 1.9–3.7)
Glucose, Bld: 101 mg/dL — ABNORMAL HIGH (ref 65–99)
Potassium: 4.3 mmol/L (ref 3.5–5.3)
Sodium: 141 mmol/L (ref 135–146)
Total Bilirubin: 1.4 mg/dL — ABNORMAL HIGH (ref 0.2–1.2)
Total Protein: 6.6 g/dL (ref 6.1–8.1)

## 2019-09-04 MED ORDER — PANTOPRAZOLE SODIUM 40 MG PO TBEC: 40.0000 mg | DELAYED_RELEASE_TABLET | Freq: Two times a day (BID) | ORAL | 2 refills | Status: DC

## 2019-09-04 NOTE — Patient Instructions (Signed)
1. Please go for labs today.  Once we get your results back, we will contact you and schedule your upper endoscopy. 2. Take pantoprazole 40 mg twice daily every day, 30 minutes before breakfast and 30 minutes before your evening meal.

## 2019-09-04 NOTE — Assessment & Plan Note (Signed)
Very pleasant 74 year old gentleman presenting for further evaluation of epigastric burning, bloating occurring over the past 4 to 5 weeks.  At baseline he is on pantoprazole 40 mg 1-2 times daily.  Takes twice daily at least 50% of the time.  States he is on this for similar symptoms, never had typical heartburn.  Some modest improvement after stopping aspirin 1 week ago.  Most of his symptoms improved and he is able to puts food on the stomach, suspect we are dealing with gastritis and/or peptic ulcer disease.  Described having dark stool prior to stopping aspirin.  Symptoms do not sound biliary in nature.  His gallbladder does remain in situ.  He has had similar symptoms like this off and on in the past.  At this point recommend upper endoscopy, await lab findings before scheduling to determine urgency.  Patient in agreement with plan.  He is on Brilinta, will discuss with Dr. Oneida Alar regarding continuing therapy in the setting of EGD.

## 2019-09-04 NOTE — Progress Notes (Addendum)
REVIEWED. EGD FOR DYSPEPSIA ON BRILINTA.     Primary Care Physician: Thea Alken  Primary Gastroenterologist:  Barney Drain, MD   Chief Complaint  Patient presents with  . Abdominal Pain    mid abd, x3-4 wks, comes and goes; getting better since stopped aspirin  . Bloated    HPI: Harry Haynes is a 74 y.o. male here for follow-up.  Last seen in March 2020.  History of GERD and adenomatous colon polyps.  Last colonoscopy January 2020, he had five, 4 to 6 mm polyps in the ascending colon, tubular adenomas, diverticulosis.  Due for 3-year surveillance exam if health permits.  Over the past 4 weeks or more he has had recurrent mid upper abdominal burning.  Worse in the mornings.  Worse when he does not have food on the stomach.  Worse with certain foods such as acidic foods.  States he never really had typical heartburn, his symptoms always was more burning in the epigastrium.  He has been taking pantoprazole 40 mg every day and takes a second dose about 50% of the time when he has this burning sensation.  He also uses Pepto-Bismol off and on.  He simply call his cardiologist to see if he could hold his aspirin to see if that would make any difference.  He is to take it at nighttime.  He stopped it 1 week ago and has noted some improvement.  Prior to this he would describe the pain 9 out of 10.  Now he states is about 7 out of 10.  Associated with worsening baseline bloating.  This sensation also improves when he eats.  Feels like he has to keep something on his stomach every 3-4 hours.  He has had some minor nausea but no vomiting.  No unintentional weight loss.  Really having to watch his diet.  Limit salads due to worsening pain and diarrhea if he eats more than twice per week.  Bowel movements generally twice per day.  Prior to stopping aspirin he states his stools were very dark.  He has noted dark stools with Pepto-Bismol but was not taking that anytime recently.  Generally though stools  will clear up after 2 days off Pepto.  Denies any bright red blood per rectum. No dysphagia.   Current Outpatient Medications  Medication Sig Dispense Refill  . carvedilol (COREG) 6.25 MG tablet Take 6.25 mg by mouth 2 (two) times daily with a meal.    . fluticasone (FLONASE) 50 MCG/ACT nasal spray Place 1-2 sprays into both nostrils daily as needed for allergies.     . fluticasone furoate-vilanterol (BREO ELLIPTA) 100-25 MCG/INH AEPB Inhale 1 puff into the lungs daily.    . isosorbide dinitrate (ISORDIL) 20 MG tablet Take 20 mg by mouth 2 (two) times daily.  0  . nitroGLYCERIN (NITROSTAT) 0.4 MG SL tablet Place 0.4 mg under the tongue every 5 (five) minutes as needed for chest pain.    . pantoprazole (PROTONIX) 40 MG tablet Take 40mg  by mouth once to twice daily as needed for acid reflux. (Patient taking differently: Take 40 mg by mouth daily. Takes extra dose if needed) 60 tablet 5  . Probiotic Product (ALIGN PO) Take 1 capsule by mouth daily.     . psyllium (METAMUCIL) 58.6 % powder Take 1 packet by mouth daily.     . rosuvastatin (CRESTOR) 10 MG tablet Take 10 mg by mouth daily.    . sacubitril-valsartan (ENTRESTO) 49-51 MG Take 1 tablet  by mouth 2 (two) times daily.    . ticagrelor (BRILINTA) 90 MG TABS tablet Take 90 mg by mouth 2 (two) times daily.     No current facility-administered medications for this visit.    Allergies as of 09/04/2019  . (No Known Allergies)    ROS:  General: Negative for anorexia, weight loss, fever, chills, fatigue, weakness. ENT: Negative for hoarseness, difficulty swallowing , nasal congestion. CV: Negative for chest pain, angina, palpitations, dyspnea on exertion, peripheral edema.  Respiratory: Negative for dyspnea at rest, dyspnea on exertion, cough, sputum, wheezing.  GI: See history of present illness. GU:  Negative for dysuria, hematuria, urinary incontinence, urinary frequency, nocturnal urination.  Endo: Negative for unusual weight change.     Physical Examination:   BP 132/86   Pulse 77   Temp (!) 96.9 F (36.1 C) (Temporal)   Ht 5\' 11"  (1.803 m)   Wt 175 lb (79.4 kg)   BMI 24.41 kg/m   General: Well-nourished, well-developed in no acute distress.  Eyes: No icterus. Mouth: Oropharyngeal mucosa moist and pink , no lesions erythema or exudate. Lungs: Clear to auscultation bilaterally.  Heart: Regular rate and rhythm, no murmurs rubs or gallops.  Abdomen: Bowel sounds are normal, mild tenderness in the epigastrium, nondistended, no hepatosplenomegaly or masses, no abdominal bruits or hernia , no rebound or guarding.   Extremities: No lower extremity edema. No clubbing or deformities. Neuro: Alert and oriented x 4   Skin: Warm and dry, no jaundice.   Psych: Alert and cooperative, normal mood and affect.    Imaging Studies: No results found.

## 2019-09-19 ENCOUNTER — Other Ambulatory Visit: Payer: Self-pay

## 2019-09-19 ENCOUNTER — Telehealth: Payer: Self-pay

## 2019-09-19 DIAGNOSIS — R140 Abdominal distension (gaseous): Secondary | ICD-10-CM

## 2019-09-19 DIAGNOSIS — K219 Gastro-esophageal reflux disease without esophagitis: Secondary | ICD-10-CM

## 2019-09-19 DIAGNOSIS — R1013 Epigastric pain: Secondary | ICD-10-CM

## 2019-09-19 NOTE — Telephone Encounter (Signed)
PA for EGD submitted via [employer] website. Case approved. [employer]# [id number], valid 09/22/19-10/22/19.

## 2019-09-20 ENCOUNTER — Other Ambulatory Visit (HOSPITAL_COMMUNITY)
Admission: RE | Admit: 2019-09-20 | Discharge: 2019-09-20 | Disposition: A | Discharge status: Home / Self Care | Payer: Medicare PPO | Source: Ambulatory Visit | Attending: Gastroenterology | Admitting: Gastroenterology

## 2019-09-20 ENCOUNTER — Other Ambulatory Visit (HOSPITAL_COMMUNITY): Payer: Medicare PPO

## 2019-09-20 ENCOUNTER — Other Ambulatory Visit: Payer: Self-pay

## 2019-09-20 DIAGNOSIS — Z20822 Contact with and (suspected) exposure to covid-19: Secondary | ICD-10-CM | POA: Diagnosis not present

## 2019-09-20 DIAGNOSIS — Z01812 Encounter for preprocedural laboratory examination: Secondary | ICD-10-CM | POA: Diagnosis present

## 2019-09-20 LAB — SARS CORONAVIRUS 2 (TAT 6-24 HRS): SARS Coronavirus 2: NEGATIVE

## 2019-09-22 ENCOUNTER — Ambulatory Visit (HOSPITAL_COMMUNITY)
Admission: RE | Admit: 2019-09-22 | Discharge: 2019-09-22 | Disposition: A | Discharge status: Home / Self Care | Payer: Medicare PPO | Attending: Gastroenterology | Admitting: Gastroenterology

## 2019-09-22 ENCOUNTER — Other Ambulatory Visit: Payer: Self-pay

## 2019-09-22 ENCOUNTER — Encounter (HOSPITAL_COMMUNITY)
Admission: RE | Disposition: A | Discharge status: Home / Self Care | Payer: Self-pay | Source: Home / Self Care | Attending: Gastroenterology

## 2019-09-22 ENCOUNTER — Encounter (HOSPITAL_COMMUNITY): Payer: Self-pay | Admitting: Gastroenterology

## 2019-09-22 DIAGNOSIS — K449 Diaphragmatic hernia without obstruction or gangrene: Secondary | ICD-10-CM | POA: Insufficient documentation

## 2019-09-22 DIAGNOSIS — Z800 Family history of malignant neoplasm of digestive organs: Secondary | ICD-10-CM | POA: Diagnosis not present

## 2019-09-22 DIAGNOSIS — Z7951 Long term (current) use of inhaled steroids: Secondary | ICD-10-CM | POA: Insufficient documentation

## 2019-09-22 DIAGNOSIS — E7800 Pure hypercholesterolemia, unspecified: Secondary | ICD-10-CM | POA: Diagnosis not present

## 2019-09-22 DIAGNOSIS — I252 Old myocardial infarction: Secondary | ICD-10-CM | POA: Diagnosis not present

## 2019-09-22 DIAGNOSIS — I2510 Atherosclerotic heart disease of native coronary artery without angina pectoris: Secondary | ICD-10-CM | POA: Insufficient documentation

## 2019-09-22 DIAGNOSIS — K219 Gastro-esophageal reflux disease without esophagitis: Secondary | ICD-10-CM | POA: Diagnosis not present

## 2019-09-22 DIAGNOSIS — Z7982 Long term (current) use of aspirin: Secondary | ICD-10-CM | POA: Insufficient documentation

## 2019-09-22 DIAGNOSIS — Z87891 Personal history of nicotine dependence: Secondary | ICD-10-CM | POA: Insufficient documentation

## 2019-09-22 DIAGNOSIS — Z86010 Personal history of colonic polyps: Secondary | ICD-10-CM | POA: Diagnosis not present

## 2019-09-22 DIAGNOSIS — J45909 Unspecified asthma, uncomplicated: Secondary | ICD-10-CM | POA: Diagnosis not present

## 2019-09-22 DIAGNOSIS — K2970 Gastritis, unspecified, without bleeding: Secondary | ICD-10-CM | POA: Diagnosis not present

## 2019-09-22 DIAGNOSIS — Z79899 Other long term (current) drug therapy: Secondary | ICD-10-CM | POA: Diagnosis not present

## 2019-09-22 DIAGNOSIS — Z791 Long term (current) use of non-steroidal anti-inflammatories (NSAID): Secondary | ICD-10-CM | POA: Insufficient documentation

## 2019-09-22 DIAGNOSIS — R1013 Epigastric pain: Secondary | ICD-10-CM | POA: Diagnosis not present

## 2019-09-22 DIAGNOSIS — I10 Essential (primary) hypertension: Secondary | ICD-10-CM | POA: Insufficient documentation

## 2019-09-22 DIAGNOSIS — K317 Polyp of stomach and duodenum: Secondary | ICD-10-CM | POA: Diagnosis not present

## 2019-09-22 DIAGNOSIS — Z801 Family history of malignant neoplasm of trachea, bronchus and lung: Secondary | ICD-10-CM | POA: Insufficient documentation

## 2019-09-22 HISTORY — DX: Unspecified asthma, uncomplicated: J45.909

## 2019-09-22 HISTORY — DX: Atherosclerotic heart disease of native coronary artery without angina pectoris: I25.10

## 2019-09-22 HISTORY — PX: ESOPHAGOGASTRODUODENOSCOPY: SHX5428

## 2019-09-22 HISTORY — PX: BIOPSY: SHX5522

## 2019-09-22 SURGERY — EGD (ESOPHAGOGASTRODUODENOSCOPY)
Anesthesia: Moderate Sedation

## 2019-09-22 MED ORDER — MEPERIDINE HCL 100 MG/ML IJ SOLN
INTRAMUSCULAR | Status: AC
  Filled 2019-09-22: qty 2

## 2019-09-22 MED ORDER — LIDOCAINE VISCOUS HCL 2 % MT SOLN
OROMUCOSAL | Status: AC
  Filled 2019-09-22: qty 15

## 2019-09-22 MED ORDER — MINERAL OIL PO OIL
TOPICAL_OIL | ORAL | Status: AC
  Filled 2019-09-22: qty 30

## 2019-09-22 MED ORDER — SODIUM CHLORIDE 0.9 % IV SOLN
INTRAVENOUS | Status: DC
  Administered 2019-09-22: 09:00:00 1000 mL via INTRAVENOUS

## 2019-09-22 MED ORDER — LIDOCAINE VISCOUS HCL 2 % MT SOLN
OROMUCOSAL | Status: DC | PRN
  Administered 2019-09-22: 1 via OROMUCOSAL

## 2019-09-22 MED ORDER — MEPERIDINE HCL 100 MG/ML IJ SOLN
INTRAMUSCULAR | Status: DC | PRN
  Administered 2019-09-22: 25 mg via INTRAVENOUS

## 2019-09-22 MED ORDER — STERILE WATER FOR IRRIGATION IR SOLN
Status: DC | PRN
  Administered 2019-09-22: 1.5 mL

## 2019-09-22 MED ORDER — MIDAZOLAM HCL 5 MG/5ML IJ SOLN
INTRAMUSCULAR | Status: AC
  Filled 2019-09-22: qty 10

## 2019-09-22 MED ORDER — MIDAZOLAM HCL 5 MG/5ML IJ SOLN
INTRAMUSCULAR | Status: DC | PRN
  Administered 2019-09-22: 2 mg via INTRAVENOUS
  Administered 2019-09-22: 1 mg via INTRAVENOUS

## 2019-09-22 NOTE — H&P (Signed)
Primary Care Physician:  Thea Alken Primary Gastroenterologist:  Dr. Oneida Alar  Pre-Procedure History & Physical: HPI:  Harry Haynes is a 74 y.o. male here for ABDOMINAL PAIN/DYSPEPSIA.BETTER HOLDING ASA AND ADDING BID PROTONIX.  Past Medical History:  Diagnosis Date  . Asthma   . Coronary artery disease   . GERD (gastroesophageal reflux disease)   . Hypercholesterolemia   . Hypertension   . MI (myocardial infarction) ([place]) 2006    Past Surgical History:  Procedure Laterality Date  . BUNIONECTOMY    . CARDIAC CATHETERIZATION     with stents. started on brilinta   . COLONOSCOPY  Aug 2015   Dr. West Carbo: normal without polyps. No significant diverticulosis  . COLONOSCOPY  2010   Dr. Algis Greenhouse: 7-8 mm polyp, tubular adenoma  . COLONOSCOPY N/A 08/19/2018   Dr. Gala Romney: five 4-6 mm polyps in ascending colon s/p resection, diverticulosis, tubular adenomas. Due for surveillance in 3 years  . 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 ESOPHAGOGASTRODUODENOSCOPY  Aug 2015   Dr. West Carbo: 7cm hiatal hernia, lower esophageal ring s/p 9 Maloney   . HAMMER TOE SURGERY    . POLYPECTOMY  08/19/2018   Procedure: POLYPECTOMY;  Surgeon: Daneil Dolin, MD;  [facility];  Service: Endoscopy;;  ascending colon(csx5)  . right inguinal hernia  1980s    Prior to Admission medications   Medication Sig Start Date End Date Taking? Authorizing Provider  aspirin EC 81 MG tablet Take 81 mg by mouth daily.   Yes [provider]  BREO ELLIPTA 200-25 MCG/INH AEPB Inhale 1 puff into the lungs daily. 07/30/19  Yes [provider]  carvedilol (COREG) 6.25 MG tablet Take 6.25 mg by mouth 2 (two) times daily with a meal.   Yes [provider]  diclofenac Sodium (VOLTAREN) 1 % GEL Apply 1 application topically 4 (four) times daily as needed (pain.).  08/18/19  Yes [provider]  fexofenadine (ALLEGRA) 180 MG tablet Take 180 mg by mouth daily.    Yes [provider]  fluticasone (FLONASE) 50 MCG/ACT nasal spray Place 1-2 sprays into both nostrils daily as needed for allergies.    Yes [provider]  isosorbide dinitrate (ISORDIL) 20 MG tablet Take 20 mg by mouth 2 (two) times daily. 06/06/18  Yes [provider]  pantoprazole (PROTONIX) 40 MG tablet Take 1 tablet (40 mg total) by mouth 2 (two) times daily before a meal. 09/04/19  Yes Mahala Menghini, PA-C  Polyethyl Glycol-Propyl Glycol (SYSTANE) 0.4-0.3 % SOLN Place 1 drop into both eyes 3 (three) times daily as needed (dry/irritated eyes.).   Yes [provider]  Probiotic Product (ALIGN PO) Take 1 capsule by mouth daily.    Yes [provider]  psyllium (METAMUCIL) 58.6 % powder Take 1 packet by mouth daily.    Yes [provider]  rosuvastatin (CRESTOR) 10 MG tablet Take 10 mg by mouth every evening.    Yes [provider]  sacubitril-valsartan (ENTRESTO) 49-51 MG Take 1 tablet by mouth 2 (two) times daily.   Yes [provider]  ticagrelor (BRILINTA) 90 MG TABS tablet Take 90 mg by mouth 2 (two) times daily.   Yes [provider]  nitroGLYCERIN (NITROSTAT) 0.4 MG SL tablet Place 0.4 mg under the tongue every 5 (five) minutes as needed for chest pain.    [provider]    Allergies as of 09/11/2019  . (No Known Allergies)  Family History  Problem Relation Age of Onset  . Colon cancer Brother        diagnosed at 1, deceased  . Lung cancer Brother     Social History   Socioeconomic History  . Marital status: Divorced    Spouse name: Not on file  . Number of children: Not on file  . Years of education: Not on file  . Highest education level: Not on file  Occupational History  . Not on file  Tobacco Use  . Smoking status: Former Smoker    Quit date: 05/08/1971    Years since quitting: 48.4  . Smokeless tobacco: Never Used  Substance and Sexual Activity  . Alcohol use: Yes     Alcohol/week: 1.0 standard drinks    Types: 1 Glasses of wine per week    Comment: occasional  . Drug use: No  . Sexual activity: Not on file  Other Topics Concern  . Not on file  Social History Narrative  . Not on file   Social Determinants of Health   Financial Resource Strain:   . Difficulty of Paying Living Expenses: Not on file  Food Insecurity:   . Worried About Charity fundraiser in the Last Year: Not on file  . Ran Out of Food in the Last Year: Not on file  Transportation Needs:   . Lack of Transportation (Medical): Not on file  . Lack of Transportation (Non-Medical): Not on file  Physical Activity:   . Days of Exercise per Week: Not on file  . Minutes of Exercise per Session: Not on file  Stress:   . Feeling of Stress : Not on file  Social Connections:   . Frequency of Communication with Friends and Family: Not on file  . Frequency of Social Gatherings with Friends and Family: Not on file  . Attends Religious Services: Not on file  . Active Member of Clubs or Organizations: Not on file  . Attends Archivist Meetings: Not on file  . Marital Status: Not on file  Intimate Partner Violence:   . Fear of Current or Ex-Partner: Not on file  . Emotionally Abused: Not on file  . Physically Abused: Not on file  . Sexually Abused: Not on file    Review of Systems: See HPI, otherwise negative ROS   Physical Exam: BP 124/85   Pulse 68   Temp 97.6 F (36.4 C) (Oral)   Resp 15   Ht 5\' 11"  (1.803 m)   Wt 75.3 kg   SpO2 100%   BMI 23.15 kg/m  General:   Alert,  pleasant and cooperative in NAD Head:  Normocephalic and atraumatic. Neck:  Supple; Lungs:  Clear throughout to auscultation.    Heart:  Regular rate and rhythm. Abdomen:  Soft, nontender and nondistended. Normal bowel sounds, without guarding, and without rebound.   Neurologic:  Alert and  oriented x4;  grossly normal neurologically.  Impression/Plan:     ABDOMINAL PAIN/DYSPEPSIA.  PLAN:  1. EGD TODAY.  DISCUSSED PROCEDURE, BENEFITS, & RISKS: < 1% chance of medication reaction, bleeding, perforation, or ASPIRATION.

## 2019-09-22 NOTE — Op Note (Signed)
[facility] Patient Name: Harry Haynes Procedure Date: 09/22/2019 9:22 AM MRN: [id number] Date of Birth: 04/07/1946 Attending MD: Barney Drain MD, MD CSN: [id number] Age: 74 Admit Type: Outpatient Procedure:                Upper GI endoscopy WITH COLD FORCEPS BIOPSY Indications:              Epigastric abdominal pain Providers:                Barney Drain MD, MD, Charlsie Quest. Theda Sers RN, RN,                            Nelma Rothman, Technician Referring MD:             Laray Anger NP, NP Medicines:                Propofol per Anesthesia Complications:            No immediate complications. Estimated Blood Loss:     Estimated blood loss was minimal. Procedure:                Pre-Anesthesia Assessment:                           - Prior to the procedure, a History and Physical                            was performed, and patient medications and                            allergies were reviewed. The patient's tolerance of                            previous anesthesia was also reviewed. The risks                            and benefits of the procedure and the sedation                            options and risks were discussed with the patient.                            All questions were answered, and informed consent                            was obtained. Prior Anticoagulants: The patient has                            taken antiplatelet medication, last dose was day of                            procedure. ASA Grade Assessment: II - A patient                            with mild systemic disease. After reviewing the  risks and benefits, the patient was deemed in                            satisfactory condition to undergo the procedure.                            After obtaining informed consent, the endoscope was                            passed under direct vision. Throughout the                            procedure, the patient's blood pressure,  pulse, and                            oxygen saturations were monitored continuously. The                            [id number]) scope was introduced through the                            mouth, and advanced to the second part of duodenum.                            The upper GI endoscopy was accomplished without                            difficulty. The patient tolerated the procedure                            well. Scope In: 9:57:06 AM Scope Out: 10:06:21 AM Total Procedure Duration: 0 hours 9 minutes 15 seconds  Findings:      The examined esophagus was normal.      A small hiatal hernia was present.      Multiple sessile polyps with no bleeding and no stigmata of recent       bleeding were found in the gastric fundus, in the gastric body and in       the gastric antrum. The polyp was removed with a cold biopsy forceps.       Resection and retrieval were complete.      Segmental moderate inflammation characterized by congestion (edema),       erosions and erythema was found on the greater curvature of the stomach       and in the gastric antrum. Biopsies were taken with a cold forceps for       Helicobacter pylori testing.      The examined duodenum was normal. Impression:               - Small hiatal hernia.                           - Multiple gastric polyps. Resected and retrieved.                           - MODERATE Gastritis. Biopsied. Moderate Sedation:  Per Anesthesia Care Recommendation:           - Patient has a contact number available for                            emergencies. The signs and symptoms of potential                            delayed complications were discussed with the                            patient. Return to normal activities tomorrow.                            Written discharge instructions were provided to the                            patient.                           - Low fat diet.                           - Continue  present medications.                           - Await pathology results.                           - Return to GI clinic in 4 months. Procedure Code(s):        --- Professional ---                           [phone], Esophagogastroduodenoscopy, flexible,                            transoral; with biopsy, single or multiple Diagnosis Code(s):        --- Professional ---                           K44.9, Diaphragmatic hernia without obstruction or                            gangrene                           K31.7, Polyp of stomach and duodenum                           K29.70, Gastritis, unspecified, without bleeding                           R10.13, Epigastric pain CPT copyright 2019 American Medical Association. All rights reserved. The codes documented in this report are preliminary and upon coder review may  be revised to meet current compliance requirements. Barney Drain, MD Barney Drain MD, MD 09/22/2019 10:22:31 AM This report has been signed electronically. Number of Addenda: 0

## 2019-09-22 NOTE — Discharge Instructions (Signed)
Your upper abdominal pain is due to gastritis due to aspirin. YOU HAVE BENIGN APPEARING STOMACH POLYPS. YOUR SMALL BOWEL LOOKED NORMAL. I biopsied your stomach.   It IS OK TO TAKE ASPIRIN TODAY.   DRINK WATER TO KEEP YOUR URINE LIGHT YELLOW.  FOLLOW A LOW FAT DIET. MEATS SHOULD BE BAKED, BROILED, OR BOILED. AVOID FRIED FOODS.    CONTINUE PROTONIX. TAKE 30 MINUTES PRIOR TO MEALS TWICE DAILY for 1 one month THEN ONCE DAILY FOREVER.   YOUR BIOPSY RESULTS WILL BE BACK IN 5 BUSINESS DAYS.  FOLLOW UP IN 4 MOS.  UPPER ENDOSCOPY AFTER CARE Read the instructions outlined below and refer to this sheet in the next week. These discharge instructions provide you with general information on caring for yourself after you leave the hospital. While your treatment has been planned according to the most current medical practices available, unavoidable complications occasionally occur. If you have any problems or questions after discharge, call DR. Hildred Mollica, [phone].  ACTIVITY  You may resume your regular activity, but move at a slower pace for the next 24 hours.   Take frequent rest periods for the next 24 hours.   Walking will help get rid of the air and reduce the bloated feeling in your belly (abdomen).   No driving for 24 hours (because of the medicine (anesthesia) used during the test).   You may shower.   Do not sign any important legal documents or operate any machinery for 24 hours (because of the anesthesia used during the test).    NUTRITION  Drink plenty of fluids.   You may resume your normal diet as instructed by your doctor.   Begin with a light meal and progress to your normal diet. Heavy or fried foods are harder to digest and may make you feel sick to your stomach (nauseated).   Avoid alcoholic beverages for 24 hours or as instructed.    MEDICATIONS  You may resume your normal medications.   WHAT YOU CAN EXPECT TODAY  Some feelings of bloating in the abdomen.     Passage of more gas than usual.    IF YOU HAD A BIOPSY TAKEN DURING THE UPPER ENDOSCOPY:  Eat a soft diet IF YOU HAVE NAUSEA, BLOATING, ABDOMINAL PAIN, OR VOMITING.    FINDING OUT THE RESULTS OF YOUR TEST Not all test results are available during your visit. DR. Oneida Alar WILL CALL YOU WITHIN 14 DAYS OF YOUR PROCEDUE WITH YOUR RESULTS. Do not assume everything is normal if you have not heard from DR. Ginni Eichler, CALL HER OFFICE AT [phone].  SEEK IMMEDIATE MEDICAL ATTENTION AND CALL THE OFFICE: [phone] IF:  You have more than a spotting of blood in your stool.   Your belly is swollen (abdominal distention).   You are nauseated or vomiting.   You have a temperature over 101F.   You have abdominal pain or discomfort that is severe or gets worse throughout the day.   Gastritis  Gastritis is an inflammation (the body's way of reacting to injury and/or infection) of the stomach. It is often caused by viral or bacterial (germ) infections. It can also be caused BY ASPIRIN, BC/GOODY POWDER'S, (IBUPROFEN) MOTRIN, OR ALEVE (NAPROXEN), chemicals (including alcohol), SPICY FOODS, and medications. This illness may be associated with generalized malaise (feeling tired, not well), UPPER ABDOMINAL STOMACH cramps, and fever. One common bacterial cause of gastritis is an organism known as H. Pylori. This can be treated with antibiotics.

## 2019-09-25 ENCOUNTER — Other Ambulatory Visit: Payer: Self-pay

## 2019-09-25 LAB — SURGICAL PATHOLOGY

## 2019-09-26 NOTE — Progress Notes (Signed)
PATIENT SCHEDULED  °

## 2020-01-24 ENCOUNTER — Ambulatory Visit (INDEPENDENT_AMBULATORY_CARE_PROVIDER_SITE_OTHER): Payer: Medicare PPO | Admitting: Gastroenterology

## 2020-01-24 ENCOUNTER — Encounter: Payer: Self-pay | Admitting: Gastroenterology

## 2020-01-24 ENCOUNTER — Other Ambulatory Visit: Payer: Self-pay

## 2020-01-24 VITALS — BP 150/85 | HR 62 | Temp 97.5°F | Ht 71.0 in | Wt 176.2 lb

## 2020-01-24 DIAGNOSIS — K219 Gastro-esophageal reflux disease without esophagitis: Secondary | ICD-10-CM

## 2020-01-24 DIAGNOSIS — Z86010 Personal history of colonic polyps: Secondary | ICD-10-CM | POA: Diagnosis not present

## 2020-01-24 NOTE — Progress Notes (Signed)
Referring Provider: Thea Alken, FNP Primary Care Physician:  Thea Alken  Primary GI: previously Dr. Oneida Alar   Chief Complaint  Patient presents with  . pp f/u    doing ok    HPI:   Harry Haynes is a 74 y.o. male presenting today with a history of multiple polyps with surveillance due 2023 if health permits, dyspepsia s/p EGD in interim from last visit (Feb 2021) with small hiatal hernia, multiple gastric polyps s/p resection, moderate gastritis s/p biopsy. Benign polyps. Negative H.pylori.   No abdominal pain since EGD. Taking Protonix just once daily now. No dysphagia. No LLQ pain. No constipation or diarrhea. Stays with metamucil and works well. No rectal bleeding. 81 mg aspirin every other day.   Tore his tendon and wearing a brace. Had surgery.    Past Medical History:  Diagnosis Date  . Asthma   . Coronary artery disease   . GERD (gastroesophageal reflux disease)   . Hypercholesterolemia   . Hypertension   . MI (myocardial infarction) ([place]) 2006    Past Surgical History:  Procedure Laterality Date  . BIOPSY  09/22/2019   Procedure: BIOPSY;  Surgeon: Danie Binder, MD;  [facility]: [facility];  Service: Endoscopy;;  . BUNIONECTOMY    . CARDIAC CATHETERIZATION     with stents. started on brilinta   . COLONOSCOPY  Aug 2015   Dr. West Carbo: normal without polyps. No significant diverticulosis  . COLONOSCOPY  2010   Dr. Algis Greenhouse: 7-8 mm polyp, tubular adenoma  . COLONOSCOPY N/A 08/19/2018   Dr. Gala Romney: five 4-6 mm polyps in ascending colon s/p resection, diverticulosis, tubular adenomas. Due for surveillance in 3 years  . 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 ESOPHAGOGASTRODUODENOSCOPY  Aug 2015   Dr. West Carbo: 7cm hiatal hernia, lower esophageal ring s/p 59 Maloney   . ESOPHAGOGASTRODUODENOSCOPY N/A 09/22/2019   Small hiatal hernia, multiple gastric polyps s/p resection, moderate gastritis s/p biopsy. Benign polyps. Negative  H.pylori.   Marland Kitchen HAMMER TOE SURGERY    . POLYPECTOMY  08/19/2018   Procedure: POLYPECTOMY;  Surgeon: Daneil Dolin, MD;  [facility]: [facility];  Service: Endoscopy;;  ascending colon(csx5)  . right inguinal hernia  1980s    Current Outpatient Medications  Medication Sig Dispense Refill  . aspirin EC 81 MG tablet Take 81 mg by mouth every other day.     Marland Kitchen BREO ELLIPTA 200-25 MCG/INH AEPB Inhale 1 puff into the lungs daily.    . carvedilol (COREG) 6.25 MG tablet Take 6.25 mg by mouth 2 (two) times daily with a meal.    . diclofenac Sodium (VOLTAREN) 1 % GEL Apply 1 application topically 4 (four) times daily as needed (pain.).     Marland Kitchen fluticasone (FLONASE) 50 MCG/ACT nasal spray Place 1-2 sprays into both nostrils daily.     . isosorbide dinitrate (ISORDIL) 20 MG tablet Take 20 mg by mouth 2 (two) times daily.  0  . montelukast (SINGULAIR) 10 MG tablet Take 10 mg by mouth at bedtime.    . nitroGLYCERIN (NITROSTAT) 0.4 MG SL tablet Place 0.4 mg under the tongue every 5 (five) minutes as needed for chest pain.    . pantoprazole (PROTONIX) 40 MG tablet Take 1 tablet (40 mg total) by mouth 2 (two) times daily before a meal. (Patient taking differently: Take 40 mg by mouth daily. ) 180 tablet 2  . Polyethyl Glycol-Propyl Glycol (SYSTANE)  0.4-0.3 % SOLN Place 1 drop into both eyes 3 (three) times daily as needed (dry/irritated eyes.).    Marland Kitchen Probiotic Product (ALIGN PO) Take 1 capsule by mouth daily.     . psyllium (METAMUCIL) 58.6 % powder Take 1 packet by mouth daily.     . rosuvastatin (CRESTOR) 10 MG tablet Take 10 mg by mouth every evening.     . sacubitril-valsartan (ENTRESTO) 49-51 MG Take 1 tablet by mouth 2 (two) times daily.    . ticagrelor (BRILINTA) 90 MG TABS tablet Take 90 mg by mouth 2 (two) times daily.    . fexofenadine (ALLEGRA) 180 MG tablet Take 180 mg by mouth daily. (Patient not taking: Reported on 01/24/2020)     No current facility-administered medications for this visit.     Allergies as of 01/24/2020  . (No Known Allergies)    Family History  Problem Relation Age of Onset  . Colon cancer Brother        diagnosed at 26, deceased  . Lung cancer Brother     Social History   Socioeconomic History  . Marital status: Divorced    Spouse name: Not on file  . Number of children: Not on file  . Years of education: Not on file  . Highest education level: Not on file  Occupational History  . Not on file  Tobacco Use  . Smoking status: Former Smoker    Quit date: 05/08/1971    Years since quitting: 48.7  . Smokeless tobacco: Never Used  Vaping Use  . Vaping Use: Never assessed  Substance and Sexual Activity  . Alcohol use: Yes    Alcohol/week: 1.0 standard drink    Types: 1 Glasses of wine per week    Comment: occasional  . Drug use: No  . Sexual activity: Not on file  Other Topics Concern  . Not on file  Social History Narrative  . Not on file   Social Determinants of Health   Financial Resource Strain:   . Difficulty of Paying Living Expenses:   Food Insecurity:   . Worried About Charity fundraiser in the Last Year:   . Arboriculturist in the Last Year:   Transportation Needs:   . Film/video editor (Medical):   Marland Kitchen Lack of Transportation (Non-Medical):   Physical Activity:   . Days of Exercise per Week:   . Minutes of Exercise per Session:   Stress:   . Feeling of Stress :   Social Connections:   . Frequency of Communication with Friends and Family:   . Frequency of Social Gatherings with Friends and Family:   . Attends Religious Services:   . Active Member of Clubs or Organizations:   . Attends Archivist Meetings:   Marland Kitchen Marital Status:     Review of Systems: Gen: Denies fever, chills, anorexia. Denies fatigue, weakness, weight loss.  CV: Denies chest pain, palpitations, syncope, peripheral edema, and claudication. Resp: Denies dyspnea at rest, cough, wheezing, coughing up blood, and pleurisy. GI: see HPI   Derm: Denies rash, itching, dry skin Psych: Denies depression, anxiety, memory loss, confusion. No homicidal or suicidal ideation.  Heme: Denies bruising, bleeding, and enlarged lymph nodes.  Physical Exam: BP (!) 150/85   Pulse 62   Temp (!) 97.5 F (36.4 C) (Temporal)   Ht 5\' 11"  (1.803 m)   Wt 176 lb 3.2 oz (79.9 kg)   BMI 24.57 kg/m  General:   Alert and oriented. No  distress noted. Pleasant and cooperative.  Head:  Normocephalic and atraumatic. Eyes:  Conjuctiva clear without scleral icterus. Mouth:  Mask in place Abdomen:  +BS, soft, non-tender and non-distended. No rebound or guarding. No HSM or masses noted. Msk:  Symmetrical without gross deformities. Normal posture. Extremities:  Without edema. Neurologic:  Alert and  oriented x4 Psych:  Alert and cooperative. Normal mood and affect.  ASSESSMENT: Harry Haynes is a 74 y.o. male presenting today with history of dyspepsia, now resolved. EGD in interim from last appointment with benign polyps, moderate gastritis, negative H.pylori. He is doing great today, now only taking Protonix once daily with good control of GERD symptoms. His next colonoscopy will be due in 2023.  As he is doing well, we will see him in 1 year or sooner if needed.    PLAN:   Protonix once daily  Return in 1 year  Colonoscopy 2023  Annitta Needs, PhD, [facility]

## 2020-01-24 NOTE — Patient Instructions (Signed)
We will see you in 1 year!  I am so glad you are doing well! Please call if any questions!  Happy Birthday!!!!   I enjoyed seeing you again today! As you know, I value our relationship and want to provide genuine, compassionate, and quality care. I welcome your feedback. If you receive a survey regarding your visit,  I greatly appreciate you taking time to fill this out. See you next time!  Annitta Needs, PhD, ANP-BC [facility]

## 2020-06-03 ENCOUNTER — Other Ambulatory Visit: Payer: Self-pay

## 2020-06-03 ENCOUNTER — Telehealth: Payer: Self-pay

## 2020-06-03 DIAGNOSIS — R195 Other fecal abnormalities: Secondary | ICD-10-CM

## 2020-06-03 LAB — CBC WITH DIFFERENTIAL/PLATELET
Absolute Monocytes: 587 cells/uL (ref 200–950)
Basophils Absolute: 61 cells/uL (ref 0–200)
Basophils Relative: 1.2 %
Eosinophils Absolute: 270 cells/uL (ref 15–500)
Eosinophils Relative: 5.3 %
HCT: 45.4 % (ref 38.5–50.0)
Hemoglobin: 14.7 g/dL (ref 13.2–17.1)
Lymphs Abs: 1234 cells/uL (ref 850–3900)
MCH: 28.9 pg (ref 27.0–33.0)
MCHC: 32.4 g/dL (ref 32.0–36.0)
MCV: 89.2 fL (ref 80.0–100.0)
MPV: 12.5 fL (ref 7.5–12.5)
Monocytes Relative: 11.5 %
Neutro Abs: 2948 cells/uL (ref 1500–7800)
Neutrophils Relative %: 57.8 %
Platelets: 170 10*3/uL (ref 140–400)
RBC: 5.09 10*6/uL (ref 4.20–5.80)
RDW: 13.4 % (ref 11.0–15.0)
Total Lymphocyte: 24.2 %
WBC: 5.1 10*3/uL (ref 3.8–10.8)

## 2020-06-03 NOTE — Telephone Encounter (Signed)
Noted  

## 2020-06-03 NOTE — Telephone Encounter (Signed)
Spoke with pt. Pt did feel dizziness when he got up quickly and that feeling went away. Pt is aware that if his symptoms change and he feels like he is going to pass out, he should proceed to the ED. Stat labs were placed and pt will pick up ifobt today. Pt plans to return [facility]OB[facility] at his apt Wednesday. Pts stools are formed and he has 1-2 BM's daily. Pt was on antibiotics a few months ago per pt.

## 2020-06-03 NOTE — Telephone Encounter (Signed)
Noted. Agree with recommendations. Further recommendations to follow CBC.

## 2020-06-03 NOTE — Progress Notes (Signed)
Referring Provider: Thea Alken, FNP Primary Care Physician:  Thea Alken Primary GI Physician: Dr. Abbey Chatters  Chief Complaint  Patient presents with  . Diarrhea    2 weeks ago  . dark stool    clearing up now but was last week for 6-7 days  . Abdominal Pain    upper abd, comes/goes    HPI:   Harry Haynes is a 74 y.o. male with history of multiple colon polyps due for surveillance colonoscopy in 2023 if health permits, GERD, dyspepsia, and reported intermittent dark stools.  Patient is presenting today after calling the office on 10/25 reporting dark-colored stool, abdominal cramping, and some diarrhea for about 1 week.  No bright red blood per rectum.  Diarrhea had resolved.  CBC obtained with hemoglobin stable at 14.7.  IFOBT also completed and was negative.   Prior report of dark stools at an office visit in January 2021.  In setting of mid upper abdominal burning.  He stopped aspirin and noted some improvement in abdominal pain and dark stools.  At that point, also noted patient had had similar symptoms on and off in the past.  EGD February 2021 with normal esophagus, small hiatal hernia, multiple sessile polyps without bleeding or stigmata of bleeding in the stomach s/p polyp removal, moderate gastritis s/p biopsy.  Pathology with gastritis, fundic gland polyps, negative for H. Pylori.  Last seen in our office June 2021.  Reported no abdominal pain since EGD.  He was only taking Protonix once daily at that point.  No other significant GI symptoms.  He was taking 81 mg aspirin every other day.  Today: Stools were dark for about 6-7 days. Not completely black.  Dark stools have resolved. Hasn't eaten anything different than he typically does. No iron. No pepto-bismol. Later remembered he had smothered onions on a chopped steak which resulted in diarrhea for 1.5 days about 2 weeks ago. Also with burning across upper abdomen that started after diarrhea. This has improved.  Currently about 3/10 in severity. Comes and goes throughout the day. Hasn't identified any triggers. Sometimes after a meal, it resolves. This is very similar to symptoms in the past that improved with increasing PPI. Currently taking Protonix daily. Today he has had some heartburn, but usually he doesn't. Thinks he has been on Nexium, Prilosec, and Prevacid historically.  Taking baby aspirin every other day. Had been doing well prior to the diarrhea starting.   No bright red blood in the stool. Bowels have returned to normal with soft formed stools daily.   Intermittent chest pain chronically. Was having some pressure in his chest and went to the emergency room [facility]) yesterday.  States they did EKG, X-ray, US abdomen which was unrevealing. No chest pain today.   No SOB. Occasional cough. No palpitations.   Feels he is losing weight. Some early satiety. Present for the last month. Lost 6 lbs in the last 4 months. No nausea or vomiting.   Past Medical History:  Diagnosis Date  . Asthma   . Coronary artery disease   . GERD (gastroesophageal reflux disease)   . Hypercholesterolemia   . Hypertension   . MI (myocardial infarction) ([place]) 2006    Past Surgical History:  Procedure Laterality Date  . BIOPSY  09/22/2019   Procedure: BIOPSY;  Surgeon: Danie Binder, MD;  Location: [facility];  Service: Endoscopy;;  . BUNIONECTOMY    . CARDIAC CATHETERIZATION     with  stents. started on brilinta   . COLONOSCOPY  Aug 2015   Dr. West Carbo: normal without polyps. No significant diverticulosis  . COLONOSCOPY  2010   Dr. Algis Greenhouse: 7-8 mm polyp, tubular adenoma  . COLONOSCOPY N/A 08/19/2018   Dr. Gala Romney: five 4-6 mm polyps in ascending colon s/p resection, diverticulosis, tubular adenomas. Due for surveillance in 3 years  . 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 ESOPHAGOGASTRODUODENOSCOPY  Aug 2015   Dr. West Carbo: 7cm hiatal hernia, lower esophageal ring s/p 34 [place]    . ESOPHAGOGASTRODUODENOSCOPY N/A 09/22/2019   Small hiatal hernia, multiple gastric polyps s/p resection, moderate gastritis s/p biopsy. Benign polyps. Negative H.pylori.   Marland Kitchen HAMMER TOE SURGERY    . POLYPECTOMY  08/19/2018   Procedure: POLYPECTOMY;  Surgeon: Daneil Dolin, MD;  Location: [facility];  Service: Endoscopy;;  ascending colon(csx5)  . right inguinal hernia  1980s    Current Outpatient Medications  Medication Sig Dispense Refill  . aspirin EC 81 MG tablet Take 81 mg by mouth every other day.     Marland Kitchen BREO ELLIPTA 200-25 MCG/INH AEPB Inhale 1 puff into the lungs daily.    . carvedilol (COREG) 6.25 MG tablet Take 6.25 mg by mouth 2 (two) times daily with a meal.    . diclofenac Sodium (VOLTAREN) 1 % GEL Apply 1 application topically 4 (four) times daily as needed (pain.).     Marland Kitchen fluticasone (FLONASE) 50 MCG/ACT nasal spray Place 1-2 sprays into both nostrils daily.     . isosorbide dinitrate (ISORDIL) 20 MG tablet Take 20 mg by mouth 2 (two) times daily.  0  . montelukast (SINGULAIR) 10 MG tablet Take 10 mg by mouth at bedtime.    . nitroGLYCERIN (NITROSTAT) 0.4 MG SL tablet Place 0.4 mg under the tongue every 5 (five) minutes as needed for chest pain.    . pantoprazole (PROTONIX) 40 MG tablet Take 1 tablet (40 mg total) by mouth 2 (two) times daily before a meal. (Patient taking differently: Take 40 mg by mouth daily. ) 180 tablet 2  . Polyethyl Glycol-Propyl Glycol (SYSTANE) 0.4-0.3 % SOLN Place 1 drop into both eyes 3 (three) times daily as needed (dry/irritated eyes.).    Marland Kitchen Probiotic Product (ALIGN PO) Take 1 capsule by mouth daily.     . psyllium (METAMUCIL) 58.6 % powder Take 1 packet by mouth daily.     . rosuvastatin (CRESTOR) 10 MG tablet Take 10 mg by mouth every evening.     . sacubitril-valsartan (ENTRESTO) 49-51 MG Take 1 tablet by mouth 2 (two) times daily.    . ticagrelor (BRILINTA) 90 MG TABS tablet Take 90 mg by mouth 2 (two) times daily.     No current  facility-administered medications for this visit.    Allergies as of 06/05/2020  . (No Known Allergies)    Family History  Problem Relation Age of Onset  . Colon cancer Brother        diagnosed at 6, deceased  . Lung cancer Brother     Social History   Socioeconomic History  . Marital status: Divorced    Spouse name: Not on file  . Number of children: Not on file  . Years of education: Not on file  . Highest education level: Not on file  Occupational History  . Not on file  Tobacco Use  . Smoking status: Former Smoker    Quit date: 05/08/1971    Years since quitting:  49.1  . Smokeless tobacco: Never Used  Vaping Use  . Vaping Use: Never assessed  Substance and Sexual Activity  . Alcohol use: Yes    Alcohol/week: 1.0 standard drink    Types: 1 Glasses of wine per week    Comment: occasional  . Drug use: No  . Sexual activity: Not on file  Other Topics Concern  . Not on file  Social History Narrative  . Not on file   Social Determinants of Health   Financial Resource Strain:   . Difficulty of Paying Living Expenses: Not on file  Food Insecurity:   . Worried About Charity fundraiser in the Last Year: Not on file  . Ran Out of Food in the Last Year: Not on file  Transportation Needs:   . Lack of Transportation (Medical): Not on file  . Lack of Transportation (Non-Medical): Not on file  Physical Activity:   . Days of Exercise per Week: Not on file  . Minutes of Exercise per Session: Not on file  Stress:   . Feeling of Stress : Not on file  Social Connections:   . Frequency of Communication with Friends and Family: Not on file  . Frequency of Social Gatherings with Friends and Family: Not on file  . Attends Religious Services: Not on file  . Active Member of Clubs or Organizations: Not on file  . Attends Archivist Meetings: Not on file  . Marital Status: Not on file    Review of Systems: Gen: Denies fever, chills, cold or flulike symptoms,  lightheadedness, dizziness, presyncope, syncope. CV: See HPI Resp: See HPI. GI: See HPI. Heme: See HPI  Physical Exam: BP 102/65   Pulse 83   Temp (!) 97.1 F (36.2 C)   Ht 5\' 10"  (1.778 m)   Wt 170 lb 6.4 oz (77.3 kg)   BMI 24.45 kg/m  General:   Alert and oriented. No distress noted. Pleasant and cooperative.  Head:  Normocephalic and atraumatic. Eyes:  Conjuctiva clear without scleral icterus. Heart:  S1, S2 present without murmurs appreciated. Lungs:  Clear to auscultation bilaterally. No wheezes, rales, or rhonchi. No distress.  Abdomen:  +BS, soft, non-tender and non-distended. No rebound or guarding. No HSM or masses noted. Msk:  Symmetrical without gross deformities. Normal posture. Extremities:  Without edema. Neurologic:  Alert and  oriented x4 Psych: Normal mood and affect.  Assessment:   history of multiple colon polyps due for surveillance colonoscopy in 2023 if health permits, GERD, dyspepsia, and reported intermittent dark stools. EGD in February 2021 for dyspepsia with  normal esophagus, small hiatal hernia, multiple sessile polyps without bleeding or stigmata of bleeding in the stomach s/p polyp removal, moderate gastritis s/p biopsy.  Pathology with gastritis, fundic gland polyps, negative for H. Pylori. He presents today with chief complaint on dark stools, upper abdominal burning, mild early satiety, and weight loss.   Dark Stools:Last week had 6-7 days of dark but not black stools following an episode of diarrhea which was likely dietary induced.  Denies taking iron or Pepto-Bismol.  Dark stools have resolved and BMs are back to normal. Hemoglobin stable at 14.7 on 06/03/2020.  I FOBT negative on 06/04/2020.  Doubt true melena.  Advised to monitor for return of dark stools.  Dyspepsia: Dyspepsia had resolved after EGD when he increase his Protonix to 40 mg twice daily for 1 month.  Had been doing very well until he ate smothered onions on chopped steak about 2  weeks ago which was followed by burning in the upper abdomen.  This has been improving slowly but continues to be intermittent daily.  Had not been having any reflux symptoms but notes some heartburn today. Adominal exam is benign.  Suspect patient is likely dealing with a gastritis flare.  We will plan to increase Protonix to 40 mg twice daily.  Weight loss: Patient's had about 6 pound weight loss in the last 4 months.  Notes mild early satiety.  Query whether this is in the setting of underlying gastritis as he is also dealing with upper abdominal burning.  Would not expect any significant upper GI gastric outlet obstruction or malignancy considering EGD was completed 8 months ago.  Could have gastroparesis.  Colonoscopy is up-to-date.  He does have a brother with history of colon cancer who passed age 34.  We are planning to increase Protonix to 40 mg twice daily due to dyspepsia.  We will see if this improves his early satiety and weight loss as well.  Requested patient to come back for a nurse visit in 4 weeks for weight check.  We will try to follow-up with him in 6-8 weeks to see how he is doing.  If he continues with early satiety and weight loss, will need to consider gastric emptying study and/or possible early interval colonoscopy.  Plan: 1. Monitor for return of dark stools 2. Increase Protonix to 40 mg twice daily 3. Counseled on GERD diet/lifestyle. 4. Return for weight check in 4 weeks. 5. Return for office visit in 6-8 weeks.

## 2020-06-03 NOTE — Telephone Encounter (Signed)
Pt called with c/o dark colored stool and wanted an apt. Pt also reports that he has some abdominal cramping and stool started out with diarrhea but is formed now.  Pt isn't taking iron supplements or Pepto Bismol. Pt states his symptoms have been present for 1 week and there has been improvement with the texture of his stool. Pt hasn't seen any blood in the toilet or when wiping. Scheduled pt for Wednesday 06/05/20. Please advise.

## 2020-06-03 NOTE — Telephone Encounter (Signed)
Any lightheadedness, dizziness, feeling like he will pass out, or significant weakness? If so, he should proceed to the emergency room. If no significant symptoms, then recommend we go ahead and obtain CBC STAT and IFOBT. Please arrange.   Regarding diarrhea, are his stools now formed? How many bowel movements daily? Nocturnal stools? Recent antibiotics?

## 2020-06-04 ENCOUNTER — Ambulatory Visit (INDEPENDENT_AMBULATORY_CARE_PROVIDER_SITE_OTHER): Payer: Medicare PPO | Admitting: Gastroenterology

## 2020-06-04 DIAGNOSIS — R195 Other fecal abnormalities: Secondary | ICD-10-CM | POA: Diagnosis not present

## 2020-06-04 LAB — IFOBT (OCCULT BLOOD): IFOBT: NEGATIVE

## 2020-06-04 NOTE — Progress Notes (Signed)
Pt returned ifobt and it was negative.  

## 2020-06-05 ENCOUNTER — Ambulatory Visit (INDEPENDENT_AMBULATORY_CARE_PROVIDER_SITE_OTHER): Payer: Medicare PPO | Admitting: Gastroenterology

## 2020-06-05 ENCOUNTER — Other Ambulatory Visit: Payer: Self-pay

## 2020-06-05 ENCOUNTER — Encounter: Payer: Self-pay | Admitting: Gastroenterology

## 2020-06-05 VITALS — BP 102/65 | HR 83 | Temp 97.1°F | Ht 70.0 in | Wt 170.4 lb

## 2020-06-05 DIAGNOSIS — R195 Other fecal abnormalities: Secondary | ICD-10-CM | POA: Diagnosis not present

## 2020-06-05 DIAGNOSIS — R1013 Epigastric pain: Secondary | ICD-10-CM | POA: Diagnosis not present

## 2020-06-05 DIAGNOSIS — K219 Gastro-esophageal reflux disease without esophagitis: Secondary | ICD-10-CM

## 2020-06-05 DIAGNOSIS — R6881 Early satiety: Secondary | ICD-10-CM | POA: Insufficient documentation

## 2020-06-05 DIAGNOSIS — R634 Abnormal weight loss: Secondary | ICD-10-CM | POA: Insufficient documentation

## 2020-06-05 NOTE — Patient Instructions (Addendum)
Please increase Protonix to 40 mg twice daily, 30 minutes before breakfast and dinner.   Follow a GERD diet:  Avoid fried, fatty, greasy, spicy, citrus foods. Avoid caffeine and carbonated beverages. Avoid chocolate. Try eating 4-6 small meals a day rather than 3 large meals. Do not eat within 3 hours of laying down.  I would like for you to come in for a weight check in 4 weeks.   We will plan to see you back in 6-8 weeks for close follow-up considering weight loss.   Aliene Altes, PA-C [facility]

## 2020-06-06 ENCOUNTER — Encounter: Payer: Self-pay | Admitting: Gastroenterology

## 2020-06-17 ENCOUNTER — Telehealth: Payer: Self-pay | Admitting: Gastroenterology

## 2020-06-17 NOTE — Telephone Encounter (Signed)
Received and reviewed records from [facility].  Patient was seen 06/04/2020 with nonspecific chest pain and abdominal pain.  He reported for 5 days of gradual onset left upper chest pressure.  Also with few weeks of epigastric abdominal pain.  No acute findings on EKG and troponins were negative.  No acute findings on chest x-ray.  CBC essentially normal.  CMP with creatinine 1.44, total bilirubin 1.8 but AST, ALT, and alk phos remain wnl.  RUQ ultrasound with unremarkable gallbladder, CBD within normal limits, no dilated intrahepatic ducts, and likely fatty liver. Recommended follow-up with GI and cardiology.   Will have records scanned into our system.   Deana:  Please let patient know I have received and reviewed records from [facility]. [place] with no significant findings. Looks like he has fatty liver but this would not be the cause of his symptoms. Per review on his labs, his bilirubin is mildly elevated. Per review of chart, he also had mildly elevated bilirubin in January 2021. I would like to obtain an HFP to evaluate this further. Otherwise we will continue with plan of care discussed at [place].  Please arrange HFP. Dx: Elevated bilirubin.

## 2020-06-18 ENCOUNTER — Other Ambulatory Visit: Payer: Self-pay

## 2020-06-18 DIAGNOSIS — R17 Unspecified jaundice: Secondary | ICD-10-CM

## 2020-06-18 NOTE — Telephone Encounter (Signed)
CALLED AND ADVISED PT THAT HIS LABS WILL BE DRAWN AT [facility] AND I HAVE ALREADY PUT IN THE ORDER FOR IT. LABS SENT TO [facility].

## 2020-06-18 NOTE — Telephone Encounter (Signed)
NOTED. SPOKE WITH THE PT REGARDING THE NOTE. STATES HE CAN COME TO [place] TOMORROW AND HAVE IT DONE. I WILL CALL PT BACK TO ADVISE OF WHICH LAB TO GO TO BECAUSE HE HAD FORGOT WHICH LAB HE WENT TO BEFORE.

## 2020-06-19 ENCOUNTER — Other Ambulatory Visit: Payer: Self-pay

## 2020-06-19 DIAGNOSIS — R17 Unspecified jaundice: Secondary | ICD-10-CM

## 2020-06-19 LAB — HEPATIC FUNCTION PANEL
AG Ratio: 1.9 (calc) (ref 1.0–2.5)
ALT: 14 U/L (ref 9–46)
AST: 16 U/L (ref 10–35)
Albumin: 4 g/dL (ref 3.6–5.1)
Alkaline phosphatase (APISO): 39 U/L (ref 35–144)
Bilirubin, Direct: 0.2 mg/dL (ref 0.0–0.2)
Globulin: 2.1 g/dL (calc) (ref 1.9–3.7)
Indirect Bilirubin: 0.9 mg/dL (calc) (ref 0.2–1.2)
Total Bilirubin: 1.1 mg/dL (ref 0.2–1.2)
Total Protein: 6.1 g/dL (ref 6.1–8.1)

## 2020-06-26 ENCOUNTER — Telehealth: Payer: Self-pay | Admitting: Internal Medicine

## 2020-06-26 NOTE — Telephone Encounter (Signed)
Pt called to say that we needed to call [name] because they denied his refill of pantoprazole. [phone]

## 2020-06-28 ENCOUNTER — Telehealth: Payer: Self-pay

## 2020-06-28 DIAGNOSIS — K219 Gastro-esophageal reflux disease without esophagitis: Secondary | ICD-10-CM

## 2020-06-28 NOTE — Telephone Encounter (Signed)
Pt needs refill on his pantoprazole 40mg . His last visit here the Dr increased him to taking it x2 a day.

## 2020-06-28 NOTE — Telephone Encounter (Signed)
Sent request to the refill pool

## 2020-06-28 NOTE — Telephone Encounter (Signed)
Opened in error

## 2020-06-30 MED ORDER — PANTOPRAZOLE SODIUM 40 MG PO TBEC: 40.0000 mg | DELAYED_RELEASE_TABLET | Freq: Two times a day (BID) | ORAL | 2 refills | Status: DC

## 2020-06-30 NOTE — Addendum Note (Signed)
Addended by: Annitta Needs on: 06/30/2020 09:04 PM   Modules accepted: Orders

## 2020-06-30 NOTE — Telephone Encounter (Signed)
Sent to [facility].

## 2020-07-01 NOTE — Telephone Encounter (Signed)
NOTED

## 2020-07-16 ENCOUNTER — Telehealth: Payer: Self-pay

## 2020-07-16 NOTE — Telephone Encounter (Signed)
Pt walked in office for his weight check per Aliene Altes, PA. Pt's weight today 07/16/20 is 171.8.

## 2020-07-16 NOTE — Telephone Encounter (Signed)
Patient has gained 1.8 lbs since his OV on 10/27. This is good news as he had been losing weight.   Please see how he is doing. Has his upper abdominal burning and sensation of feeling full improved with increasing Protonix to 40 mg BID?

## 2020-07-17 NOTE — Telephone Encounter (Signed)
Noted.  I am glad he is feeling better.  We will follow with him in the office as scheduled.

## 2020-07-17 NOTE — Telephone Encounter (Signed)
Spoke with pt. Pt states that he is doing well. The Pantorpazole 40 md bid helped and per pt, he has backed down to taking medication once daily. Pt is currently doing well with taking medication once daily.

## 2020-07-18 NOTE — Telephone Encounter (Signed)
Noted  

## 2020-08-10 NOTE — Progress Notes (Signed)
Referring Provider: Thea Alken, FNP Primary Care Physician:  Thea Alken Primary GI Physician: Dr. Abbey Chatters  Chief Complaint  Patient presents with  . Gastroesophageal Reflux    Doing better. Stool no longer dark    HPI:   Harry Haynes is a 75 y.o. male  with history of multiple colon polyps due for surveillance colonoscopy in 2023 if health permits, GERD, dyspepsia, and reported intermittent dark stools. EGD February 2021 with normal esophagus, small hiatal hernia, multiple sessile polyps without bleeding or stigmata of bleeding in the stomach s/p polyp removal, moderate gastritis s/p biopsy.  Pathology with gastritis, fundic gland polyps, negative for H. Pylori. He is presenting today for follow-up of epigastric abdominal pain, GERD, dark stools, early satiety, and weight loss. Last seen in our office 06/05/20 for the same.   Prior to his last visit, he called our office on 10/27 reporting dark-colored stool, abdominal cramping, and some diarrhea for about 1 week that had resolved. CBC obtained with hemoglobin stable at 14.7.  IFOBT also completed and was negative. At his OV, he reported dark, but not black, stools for about 6-7 days that had resolved at that point. No iron. No pepto-bismol. Prior to onset of dark stools, he had smothered onions and chopped steak which resulted in diarrhea as well as epigastric burning. Diarrhea resolved. Epigastric burning improving. Similar to prior flares of dyspepsia. 6 lb weight loss over the last 4 months, some early satiety. He had also been evaluated for chest pain at [facility] ED the day prior to his OV and stated his evaluation checked out fine and CP resolved. Suspected gastritis flare and planned to increase Protonix to 40 mg BID. Hopeful that increasing PPI would help early satiety, and if not, consider GES. If weight continued to trend down, would need to consider endoscopic evaluation including early interval colonoscopy.    Received and reviewed records from [facility]. Patient was seen 06/04/2020 with nonspecific chest pain and abdominal pain.  He reported for 5 days of gradual onset left upper chest pressure.  Also with few weeks of epigastric abdominal pain.  No acute findings on EKG and troponins were negative.  No acute findings on chest x-ray.  CBC essentially normal.  CMP with creatinine 1.44, total bilirubin 1.8 but AST, ALT, and alk phos remain wnl.  RUQ ultrasound with unremarkable gallbladder, CBD within normal limits, no dilated intrahepatic ducts, and likely fatty liver. Recommended follow-up with GI and cardiology. I recommended HFP to fractionate bilirubin.   HFP 06/19/20 wnl.   Patient returned for weight check on 12/7. Weight was 171.8 which was a 1.8lb weight gain since last visit. Patient stated Protonix BID had helped epigastric abdominal pain and he had backed down to once daily at that point and continued to do well.   Today:  Doing very well. Continues with Protonix 40 mg daily. He took Protonix 40 mg twice daily for about 4 weeks. GERD is very well controlled. He has cut back on carbonated beverages, citrus foods, coffee, and chocolate. He has also had resolution of epigastric abdominal pain, early satiety. No recurrence of dark stools. Gained 2 pounds since last office visit. Appetite is much improved. He is eating well. No nausea, vomiting, abdominal pain, constipation, diarrhea, or BRBPR.  He has also not had any recurrence of chest pain. Denies heart palpitations, shortness of breath, cough, cold or flulike symptoms, presyncope, syncope.  Past Medical History:  Diagnosis Date  . Asthma   .  Coronary artery disease   . GERD (gastroesophageal reflux disease)   . Hypercholesterolemia   . Hypertension   . MI (myocardial infarction) ([place]) 2006    Past Surgical History:  Procedure Laterality Date  . BIOPSY  09/22/2019   Procedure: BIOPSY;  Surgeon: Danie Binder, MD;   [facility];  Service: Endoscopy;;  . BUNIONECTOMY    . CARDIAC CATHETERIZATION     with stents. started on brilinta   . COLONOSCOPY  Aug 2015   Dr. West Carbo: normal without polyps. No significant diverticulosis  . COLONOSCOPY  2010   Dr. Algis Greenhouse: 7-8 mm polyp, tubular adenoma  . COLONOSCOPY N/A 08/19/2018   Dr. Gala Romney: five 4-6 mm polyps in ascending colon s/p resection, diverticulosis, tubular adenomas. Due for surveillance in 3 years  . 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 ESOPHAGOGASTRODUODENOSCOPY  Aug 2015   Dr. West Carbo: 7cm hiatal hernia, lower esophageal ring s/p 68 Maloney   . ESOPHAGOGASTRODUODENOSCOPY N/A 09/22/2019   Small hiatal hernia, multiple gastric polyps s/p resection, moderate gastritis s/p biopsy. Benign polyps. Negative H.pylori.   Marland Kitchen HAMMER TOE SURGERY    . POLYPECTOMY  08/19/2018   Procedure: POLYPECTOMY;  Surgeon: Daneil Dolin, MD;  [facility];  Service: Endoscopy;;  ascending colon(csx5)  . right inguinal hernia  1980s    Current Outpatient Medications  Medication Sig Dispense Refill  . aspirin EC 81 MG tablet Take 81 mg by mouth every other day.     Marland Kitchen BREO ELLIPTA 200-25 MCG/INH AEPB Inhale 1 puff into the lungs daily.    . carvedilol (COREG) 6.25 MG tablet Take 6.25 mg by mouth 2 (two) times daily with a meal.    . diclofenac Sodium (VOLTAREN) 1 % GEL Apply 1 application topically 4 (four) times daily as needed (pain.).     Marland Kitchen fluticasone (FLONASE) 50 MCG/ACT nasal spray Place 1-2 sprays into both nostrils daily.    . isosorbide dinitrate (ISORDIL) 20 MG tablet Take 20 mg by mouth 2 (two) times daily.  0  . montelukast (SINGULAIR) 10 MG tablet Take 10 mg by mouth at bedtime.    . nitroGLYCERIN (NITROSTAT) 0.4 MG SL tablet Place 0.4 mg under the tongue every 5 (five) minutes as needed for chest pain.    . pantoprazole (PROTONIX) 40 MG tablet Take 1 tablet (40 mg total) by mouth 2 (two) times daily before a meal.  (Patient taking differently: Take 40 mg by mouth daily.) 180 tablet 2  . Polyethyl Glycol-Propyl Glycol (SYSTANE) 0.4-0.3 % SOLN Place 1 drop into both eyes 3 (three) times daily as needed (dry/irritated eyes.).    Marland Kitchen Probiotic Product (ALIGN PO) Take 1 capsule by mouth daily.     . psyllium (METAMUCIL) 58.6 % powder Take 1 packet by mouth daily.     . rosuvastatin (CRESTOR) 10 MG tablet Take 10 mg by mouth every evening.     . sacubitril-valsartan (ENTRESTO) 49-51 MG Take 1 tablet by mouth 2 (two) times daily.    . tamsulosin (FLOMAX) 0.4 MG CAPS capsule Take 0.4 mg by mouth daily.    . ticagrelor (BRILINTA) 90 MG TABS tablet Take 90 mg by mouth 2 (two) times daily.     No current facility-administered medications for this visit.    Allergies as of 08/12/2020  . (No Known Allergies)    Family History  Problem Relation Age of Onset  . Colon cancer Brother  diagnosed at 52, deceased  . Lung cancer Brother     Social History   Socioeconomic History  . Marital status: Divorced    Spouse name: Not on file  . Number of children: Not on file  . Years of education: Not on file  . Highest education level: Not on file  Occupational History  . Not on file  Tobacco Use  . Smoking status: Former Smoker    Quit date: 05/08/1971    Years since quitting: 49.2  . Smokeless tobacco: Never Used  Vaping Use  . Vaping Use: Not on file  Substance and Sexual Activity  . Alcohol use: Not Currently    Alcohol/week: 1.0 standard drink    Types: 1 Glasses of wine per week    Comment: occasional; 08/12/20 no etoh as this time  . Drug use: No  . Sexual activity: Not on file  Other Topics Concern  . Not on file  Social History Narrative  . Not on file   Social Determinants of Health   Financial Resource Strain: Not on file  Food Insecurity: Not on file  Transportation Needs: Not on file  Physical Activity: Not on file  Stress: Not on file  Social Connections: Not on file    Review  of Systems: Gen: See HPI CV: See HPI Resp: See HPI  GI: See HPI Heme: See HPI  Physical Exam: BP (!) 143/85   Pulse 72   Temp 97.7 F (36.5 C) (Temporal)   Ht 5' 10"  (1.778 m)   Wt 172 lb 3.2 oz (78.1 kg)   BMI 24.71 kg/m  General:   Alert and oriented. No distress noted. Pleasant and cooperative.  Head:  Normocephalic and atraumatic. Eyes:  Conjuctiva clear without scleral icterus. Heart:  S1, S2 present without murmurs appreciated. Lungs:  Clear to auscultation bilaterally. No wheezes, rales, or rhonchi. No distress.  Abdomen:  +BS, soft, non-tender and non-distended. No rebound or guarding. No HSM or masses noted. Msk:  Symmetrical without gross deformities. Normal posture. Extremities:  Without edema. Neurologic:  Alert and  oriented x4 Psych: Normal mood and affect.

## 2020-08-12 ENCOUNTER — Encounter: Payer: Self-pay | Admitting: Gastroenterology

## 2020-08-12 ENCOUNTER — Other Ambulatory Visit: Payer: Self-pay

## 2020-08-12 ENCOUNTER — Ambulatory Visit: Payer: Medicare PPO | Admitting: Gastroenterology

## 2020-08-12 VITALS — BP 143/85 | HR 72 | Temp 97.7°F | Ht 70.0 in | Wt 172.2 lb

## 2020-08-12 DIAGNOSIS — K219 Gastro-esophageal reflux disease without esophagitis: Secondary | ICD-10-CM | POA: Diagnosis not present

## 2020-08-12 DIAGNOSIS — R195 Other fecal abnormalities: Secondary | ICD-10-CM

## 2020-08-12 DIAGNOSIS — R634 Abnormal weight loss: Secondary | ICD-10-CM

## 2020-08-12 DIAGNOSIS — R6881 Early satiety: Secondary | ICD-10-CM

## 2020-08-12 DIAGNOSIS — R1013 Epigastric pain: Secondary | ICD-10-CM

## 2020-08-12 NOTE — Assessment & Plan Note (Signed)
Addressed under GERD 

## 2020-08-12 NOTE — Patient Instructions (Signed)
I am glad you are feeling much better!  Continue taking Protonix 40 mg daily 30 minutes before breakfast.  Continue avoiding known acid reflux triggers.  General guidance for GERD diet/lifestyle:  Avoid fried, fatty, greasy, spicy, citrus foods. Avoid caffeine and carbonated beverages. Avoid chocolate. Try eating 4-6 small meals a day rather than 3 large meals. Do not eat within 3 hours of laying down. Prop head of bed up on wood or bricks to create a 6 inch incline.  We will plan to see back in 6 months. Do not hesitate to call if you have any questions or concerns prior.  Ermalinda Memos, PA-C [facility] Gastroenterology

## 2020-08-12 NOTE — Assessment & Plan Note (Addendum)
Chronic. Well-controlled on Protonix 40 mg daily. Previously, had a brief flare of GERD about 2 months ago with associated epigastric abdominal pain, dark but not black stools, early satiety, and few pound weight loss. CBC was updated in October with hemoglobin 14.7. IFOBT was also completed and was negative. We increased Protonix to 40 mg twice daily for about 4 weeks, and symptoms improved. Today, he continues to do very well with Protonix 40 mg daily and has made dietary changes to avoid common GERD triggers including carbonated beverages, citrus foods, coffee, and chocolate. He has had resolution of all upper GI symptoms, is eating well, and has gained 2 pounds over the last 2 months. Dark stools had actually resolved just prior to last OV in October. Recent EGD on file February 2021 with normal esophagus, small hiatal hernia, multiple sessile polyps without bleeding or stigmata of bleeding in the stomach s/p polyp removal, moderate gastritis s/p biopsy.  Pathology with gastritis, fundic gland polyps, negative for H. Pylori.   Plan: Continue Protonix 40 mg daily 30 minutes before breakfast. Continue avoiding known acid reflux triggers. Provided with general GERD diet/lifestyle recommendations:   Follow a GERD diet:   Avoid fried, fatty, greasy, spicy, citrus foods.  Avoid caffeine and carbonated beverages.  Avoid chocolate.  Try eating 4-6 small meals a day rather than 3 large meals.  Do not eat within 3 hours of laying down.  Prop head of bed up on wood or bricks to create a 6 inch incline. Follow-up in 6 months or sooner if needed.

## 2020-08-12 NOTE — Progress Notes (Signed)
Cc'ed to pcp °

## 2020-10-08 HISTORY — PX: CATARACT EXTRACTION: SUR2

## 2021-02-11 NOTE — Progress Notes (Signed)
Referring Provider: Thea Alken, FNP Primary Care Physician:  Thea Alken Primary GI Physician: Dr. Abbey Chatters  Chief Complaint  Patient presents with   Gastroesophageal Reflux    Doing ok. Taking Protonix daily   dark stool    Ok now    HPI:   Harry Haynes is a 75 y.o. male with history of multiple colon polyps due for surveillance colonoscopy in 2023 if health permits, GERD, dyspepsia, and reported intermittent dark stools.  EGD February 2021 with normal esophagus, small hiatal hernia, multiple sessile polyps without bleeding or stigmata of bleeding in the stomach s/p polyp removal, moderate gastritis biopsied.  Pathology with gastritis, fundic gland polyps, negative for H. pylori.  He is presenting today for follow-up.  Last seen in our office 08/12/2020 for follow-up of previously reported dark stools, abdominal cramping/burning and diarrhea that began in October 2021 after eating some other onions and chopped steak.  Associated early satiety.  He had CBC on file with hemoglobin of 14.7 and I FOBT negative.  Protonix was increased to 40 mg twice daily at that time.  At the time of his office visit in January, he was doing very well.  He had reduced Protonix back to once daily after 4 weeks.  He also cut out carbonated beverages, citrus foods, coffee, and chocolate. GERD was well controlled, abdominal pain and early satiety resolved.  No recurrent dark stools.  Had gained 2 pounds in 3 months.  Recommended continuing current medications and follow-up in 6 months.  Today:  States he is doing very well.  GERD is well controlled on Protonix 40 mg daily.  Denies nausea, vomiting, dysphagia, abdominal pain, melena, rectal bleeding, change in bowel habits, constipation, diarrhea.  Eating well. Gained 2 lbs in 6 months.  When exercising/going up stairs, he is getting a little pressure in his chest and left shoulder. Present for more than 6 months. Associated SOB. Has Stress test  tomorrow.  Orpah Greek, MD is cardiologist.   Past Medical History:  Diagnosis Date   Asthma    Coronary artery disease    GERD (gastroesophageal reflux disease)    Hypercholesterolemia    Hypertension    MI (myocardial infarction) ([place]) 2006    Past Surgical History:  Procedure Laterality Date   BIOPSY  09/22/2019   Procedure: BIOPSY;  Surgeon: Danie Binder, MD;  [facility];  Service: Endoscopy;;   BUNIONECTOMY     CARDIAC CATHETERIZATION     with stents. started on brilinta    CATARACT EXTRACTION Bilateral 10/2020   COLONOSCOPY  03/2014   Dr. West Carbo: normal without polyps. No significant diverticulosis   COLONOSCOPY  2010   Dr. Algis Greenhouse: 7-8 mm polyp, tubular adenoma   COLONOSCOPY N/A 08/19/2018   Dr. Gala Romney: five 4-6 mm polyps in ascending colon s/p resection, diverticulosis, tubular adenomas. Due for surveillance in 3 years   CORONARY ARTERY BYPASS GRAFT  2006   triple vessel    ESOPHAGOGASTRODUODENOSCOPY  03/2014   Dr. West Carbo: 7cm hiatal hernia, lower esophageal ring s/p 17 Maloney    ESOPHAGOGASTRODUODENOSCOPY N/A 09/22/2019   Small hiatal hernia, multiple gastric polyps s/p resection, moderate gastritis s/p biopsy. Benign polyps. Negative H.pylori.    HAMMER TOE SURGERY     POLYPECTOMY  08/19/2018   Procedure: POLYPECTOMY;  Surgeon: Daneil Dolin, MD;  [facility];  Service: Endoscopy;;  ascending colon(csx5)   right inguinal hernia  1980s    Current Outpatient Medications  Medication Sig Dispense Refill   aspirin EC 81 MG tablet Take 81 mg by mouth every other day.      BREO ELLIPTA 200-25 MCG/INH AEPB Inhale 1 puff into the lungs daily.     carvedilol (COREG) 6.25 MG tablet Take 6.25 mg by mouth 2 (two) times daily with a meal.     fluticasone (FLONASE) 50 MCG/ACT nasal spray Place 1-2 sprays into both nostrils daily.     isosorbide dinitrate (ISORDIL) 20 MG tablet Take 20 mg by mouth 2 (two) times daily.  0   montelukast  (SINGULAIR) 10 MG tablet Take 10 mg by mouth at bedtime.     nitroGLYCERIN (NITROSTAT) 0.4 MG SL tablet Place 0.4 mg under the tongue every 5 (five) minutes as needed for chest pain.     pantoprazole (PROTONIX) 40 MG tablet Take 1 tablet (40 mg total) by mouth 2 (two) times daily before a meal. (Patient taking differently: Take 40 mg by mouth daily.) 180 tablet 2   Polyethyl Glycol-Propyl Glycol (SYSTANE) 0.4-0.3 % SOLN Place 1 drop into both eyes 3 (three) times daily as needed (dry/irritated eyes.).     Probiotic Product (ALIGN PO) Take 1 capsule by mouth daily.      psyllium (METAMUCIL) 58.6 % powder Take 1 packet by mouth daily.      rosuvastatin (CRESTOR) 10 MG tablet Take 10 mg by mouth every evening.      sacubitril-valsartan (ENTRESTO) 49-51 MG Take 1 tablet by mouth 2 (two) times daily.     tamsulosin (FLOMAX) 0.4 MG CAPS capsule Take 0.4 mg by mouth daily.     ticagrelor (BRILINTA) 90 MG TABS tablet Take 90 mg by mouth 2 (two) times daily.     No current facility-administered medications for this visit.    Allergies as of 02/12/2021   (No Known Allergies)    Family History  Problem Relation Age of Onset   Colon cancer Brother        diagnosed at 32, deceased   Lung cancer Brother     Social History   Socioeconomic History   Marital status: Divorced    Spouse name: Not on file   Number of children: Not on file   Years of education: Not on file   Highest education level: Not on file  Occupational History   Not on file  Tobacco Use   Smoking status: Former    Pack years: 0.00    Types: Cigarettes    Quit date: 05/08/1971    Years since quitting: 49.8   Smokeless tobacco: Never  Vaping Use   Vaping Use: Not on file  Substance and Sexual Activity   Alcohol use: Not Currently    Alcohol/week: 1.0 standard drink    Types: 1 Glasses of wine per week    Comment: 02/12/21   Drug use: No   Sexual activity: Not on file  Other Topics Concern   Not on file  Social  History Narrative   Not on file   Social Determinants of Health   Financial Resource Strain: Not on file  Food Insecurity: Not on file  Transportation Needs: Not on file  Physical Activity: Not on file  Stress: Not on file  Social Connections: Not on file    Review of Systems: Gen: Denies fever, chills, cold or flulike symptoms, presyncope, syncope. CV: See HPI Resp: See HPI GI: See HPI  Heme: See HPI  Physical Exam: BP 118/75   Pulse 60   Temp 97.7  F (36.5 C) (Temporal)   Ht 5\' 10"  (1.778 m)   Wt 174 lb (78.9 kg)   BMI 24.97 kg/m  General:   Alert and oriented. No distress noted. Pleasant and cooperative.  Head:  Normocephalic and atraumatic. Eyes:  Conjuctiva clear without scleral icterus. Heart:  S1, S2 present without murmurs appreciated. Lungs:  Clear to auscultation bilaterally. No wheezes, rales, or rhonchi. No distress.  Abdomen:  +BS, soft, non-tender and non-distended. No rebound or guarding. No HSM or masses noted. Msk:  Symmetrical without gross deformities. Normal posture. Extremities:  Without edema. Neurologic:  Alert and  oriented x4 Psych:  Normal mood and affect.  Assessment: 75 year old male with history of multiple colon polyps due for surveillance colonoscopy in 2023 if health permits, GERD, dyspepsia s/p EGD in February 2021 with normal esophagus, small hiatal hernia, fundic gland polyps, and moderate gastritis, presenting today for routine follow-up.  Clinically, he is doing very well on Protonix 40 mg daily.  He has no significant upper or lower GI symptoms.  No alarm symptoms. Advised to continue current medications.     Of note, he does report intermittent chest pain with associated shortness of breath with activity and is scheduled for stress test tomorrow.  Plan:  1.  Continue Protonix 40 mg daily 30 minutes before breakfast. 2.  Follow-up in January 2023 to discuss scheduling colonoscopy.    Aliene Altes, PA-C [facility]  Gastroenterology 02/12/2021

## 2021-02-12 ENCOUNTER — Other Ambulatory Visit: Payer: Self-pay

## 2021-02-12 ENCOUNTER — Encounter: Payer: Self-pay | Admitting: Gastroenterology

## 2021-02-12 ENCOUNTER — Ambulatory Visit (INDEPENDENT_AMBULATORY_CARE_PROVIDER_SITE_OTHER): Payer: Medicare PPO | Admitting: Gastroenterology

## 2021-02-12 VITALS — BP 118/75 | HR 60 | Temp 97.7°F | Ht 70.0 in | Wt 174.0 lb

## 2021-02-12 DIAGNOSIS — K219 Gastro-esophageal reflux disease without esophagitis: Secondary | ICD-10-CM | POA: Diagnosis not present

## 2021-02-12 DIAGNOSIS — Z86010 Personal history of colonic polyps: Secondary | ICD-10-CM | POA: Diagnosis not present

## 2021-02-12 NOTE — Patient Instructions (Signed)
Continue Protonix 40 mg daily 30 minutes before breakfast.  We will plan to see you back in January 2023 to discuss scheduling your colonoscopy.  Do not hesitate to call if you have any questions or concerns prior to her next visit.  It was great to see you today!  Aliene Altes, PA-C [facility] Gastroenterology

## 2021-04-30 ENCOUNTER — Other Ambulatory Visit: Payer: Self-pay | Admitting: Gastroenterology

## 2021-04-30 DIAGNOSIS — K219 Gastro-esophageal reflux disease without esophagitis: Secondary | ICD-10-CM

## 2021-09-02 ENCOUNTER — Encounter: Payer: Self-pay | Admitting: Internal Medicine

## 2021-09-18 ENCOUNTER — Encounter: Payer: Self-pay | Admitting: *Deleted

## 2021-11-25 ENCOUNTER — Telehealth: Payer: Self-pay | Admitting: *Deleted

## 2021-11-25 ENCOUNTER — Other Ambulatory Visit: Payer: Self-pay | Admitting: Gastroenterology

## 2021-11-25 DIAGNOSIS — K219 Gastro-esophageal reflux disease without esophagitis: Secondary | ICD-10-CM

## 2021-11-25 MED ORDER — PANTOPRAZOLE SODIUM 40 MG PO TBEC: 40.0000 mg | DELAYED_RELEASE_TABLET | Freq: Every day | ORAL | 1 refills | Status: DC

## 2021-11-25 NOTE — Telephone Encounter (Signed)
Rx sent 

## 2021-11-25 NOTE — Telephone Encounter (Signed)
Received prescription request for Pantoprazole 40 mg from [facility]. Pt last seen in [place] on 08/12/20  ?

## 2021-11-25 NOTE — Telephone Encounter (Signed)
Can we verify with patient that his Rx needs to be sent to [place]? I sent in 1 year supply in September 2022 to [facility] on [address] in [place].  ?

## 2021-11-25 NOTE — Telephone Encounter (Signed)
Spoke to pt, he informed me that he would like the prescription sent to [place] [place] because he can get a 90 day supply.  ?

## 2021-11-25 NOTE — Telephone Encounter (Signed)
Noted  

## 2021-12-18 ENCOUNTER — Ambulatory Visit: Payer: Medicare PPO | Admitting: Internal Medicine

## 2021-12-18 ENCOUNTER — Encounter: Payer: Self-pay | Admitting: Internal Medicine

## 2021-12-18 VITALS — BP 122/68 | HR 76 | Temp 97.5°F | Ht 70.0 in | Wt 175.2 lb

## 2021-12-18 DIAGNOSIS — K219 Gastro-esophageal reflux disease without esophagitis: Secondary | ICD-10-CM

## 2021-12-18 DIAGNOSIS — Z86010 Personal history of colonic polyps: Secondary | ICD-10-CM | POA: Diagnosis not present

## 2021-12-18 DIAGNOSIS — Z800 Family history of malignant neoplasm of digestive organs: Secondary | ICD-10-CM | POA: Diagnosis not present

## 2021-12-18 NOTE — Progress Notes (Signed)
? ? ?Referring Provider: Thea Alken, FNP ?Primary Care Physician:  Thea Alken ?Primary GI:  Dr. Abbey Chatters ? ?Chief Complaint  ?Patient presents with  ? Colonoscopy  ? ? ?HPI:   ?Harry Haynes is a 76 y.o. male who presents to the clinic today for follow-up visit.  Due for surveillance colonoscopy.  Last colonoscopy 2020 with 5 tubular adenomas removed.  Also notes family history of colon cancer in his brother.  No melena hematochezia.  No abdominal pain.  No unintentional weight loss.  No change in bowel habits. ? ?Does have chronic GERD which is well controlled on pantoprazole daily.  No dysphagia/odynophagia.  No epigastric or chest pain. ? ?Significant cardiac history.  Sees Dr. Sabra Heck in [place].  Notes coronary stent placement 2018.  Chronically on Brilinta.  Also on Entresto for likely heart failure though I do not have access to any recent echocardiograms. ? ?Past Medical History:  ?Diagnosis Date  ? Asthma   ? Coronary artery disease   ? GERD (gastroesophageal reflux disease)   ? Hypercholesterolemia   ? Hypertension   ? MI (myocardial infarction) ([place]) 2006  ? ? ?Past Surgical History:  ?Procedure Laterality Date  ? BIOPSY  09/22/2019  ? Procedure: BIOPSY;  Surgeon: Danie Binder, MD;  [facility]: [facility];  Service: Endoscopy;;  ? BUNIONECTOMY    ? CARDIAC CATHETERIZATION    ? with stents. started on brilinta   ? CATARACT EXTRACTION Bilateral 10/2020  ? COLONOSCOPY  03/2014  ? Dr. West Carbo: normal without polyps. No significant diverticulosis  ? COLONOSCOPY  2010  ? Dr. Algis Greenhouse: 7-8 mm polyp, tubular adenoma  ? COLONOSCOPY N/A 08/19/2018  ? Dr. Gala Romney: five 4-6 mm polyps in ascending colon s/p resection, diverticulosis, tubular adenomas. Due for surveillance in 3 years  ? CORONARY ARTERY BYPASS GRAFT  2006  ? triple vessel   ? ESOPHAGOGASTRODUODENOSCOPY  03/2014  ? Dr. West Carbo: 7cm hiatal hernia, lower esophageal ring s/p 51 [place]   ? ESOPHAGOGASTRODUODENOSCOPY N/A 09/22/2019  ? Small  hiatal hernia, multiple gastric polyps s/p resection, moderate gastritis s/p biopsy. Benign polyps. Negative H.pylori.   ? HAMMER TOE SURGERY    ? POLYPECTOMY  08/19/2018  ? Procedure: POLYPECTOMY;  Surgeon: Daneil Dolin, MD;  Location: [facility];  Service: Endoscopy;;  ascending colon(csx5)  ? right inguinal hernia  1980s  ? ? ?Current Outpatient Medications  ?Medication Sig Dispense Refill  ? aspirin EC 81 MG tablet Take 81 mg by mouth every other day.     ? BREO ELLIPTA 200-25 MCG/INH AEPB Inhale 1 puff into the lungs daily.    ? carvedilol (COREG) 6.25 MG tablet Take 6.25 mg by mouth 2 (two) times daily with a meal.    ? fluticasone (FLONASE) 50 MCG/ACT nasal spray Place 1-2 sprays into both nostrils daily.    ? isosorbide dinitrate (ISORDIL) 20 MG tablet Take 20 mg by mouth 2 (two) times daily.  0  ? montelukast (SINGULAIR) 10 MG tablet Take 10 mg by mouth at bedtime.    ? nitroGLYCERIN (NITROSTAT) 0.4 MG SL tablet Place 0.4 mg under the tongue every 5 (five) minutes as needed for chest pain.    ? pantoprazole (PROTONIX) 40 MG tablet Take 1 tablet (40 mg total) by mouth daily before breakfast. 90 tablet 1  ? Probiotic Product (ALIGN PO) Take 1 capsule by mouth daily.     ? psyllium (METAMUCIL) 58.6 % powder Take 1 packet by mouth daily.     ?  rosuvastatin (CRESTOR) 10 MG tablet Take 10 mg by mouth every evening.     ? sacubitril-valsartan (ENTRESTO) 49-51 MG Take 1 tablet by mouth 2 (two) times daily.    ? ticagrelor (BRILINTA) 90 MG TABS tablet Take 90 mg by mouth 2 (two) times daily.    ? Polyethyl Glycol-Propyl Glycol (SYSTANE) 0.4-0.3 % SOLN Place 1 drop into both eyes 3 (three) times daily as needed (dry/irritated eyes.). (Patient not taking: Reported on 12/18/2021)    ? tamsulosin (FLOMAX) 0.4 MG CAPS capsule Take 0.4 mg by mouth daily. (Patient not taking: Reported on 12/18/2021)    ? ?No current facility-administered medications for this visit.  ? ? ?Allergies as of 12/18/2021  ? (No Known  Allergies)  ? ? ?Family History  ?Problem Relation Age of Onset  ? Colon cancer Brother   ?     diagnosed at 59, deceased  ? Lung cancer Brother   ? ? ?Social History  ? ?Socioeconomic History  ? Marital status: Divorced  ?  Spouse name: Not on file  ? Number of children: Not on file  ? Years of education: Not on file  ? Highest education level: Not on file  ?Occupational History  ? Not on file  ?Tobacco Use  ? Smoking status: Former  ?  Types: Cigarettes  ?  Quit date: 05/08/1971  ?  Years since quitting: 50.6  ? Smokeless tobacco: Never  ?Vaping Use  ? Vaping Use: Not on file  ?Substance and Sexual Activity  ? Alcohol use: Not Currently  ?  Alcohol/week: 1.0 standard drink  ?  Types: 1 Glasses of wine per week  ?  Comment: 02/12/21  ? Drug use: No  ? Sexual activity: Not on file  ?Other Topics Concern  ? Not on file  ?Social History Narrative  ? Not on file  ? ?Social Determinants of Health  ? ?Financial Resource Strain: Not on file  ?Food Insecurity: Not on file  ?Transportation Needs: Not on file  ?Physical Activity: Not on file  ?Stress: Not on file  ?Social Connections: Not on file  ? ? ?Subjective: ?Review of Systems  ?Constitutional:  Negative for chills and fever.  ?HENT:  Negative for congestion and hearing loss.   ?Eyes:  Negative for blurred vision and double vision.  ?Respiratory:  Negative for cough and shortness of breath.   ?Cardiovascular:  Negative for chest pain and palpitations.  ?Gastrointestinal:  Positive for heartburn. Negative for abdominal pain, blood in stool, constipation, diarrhea, melena and vomiting.  ?Genitourinary:  Negative for dysuria and urgency.  ?Musculoskeletal:  Negative for joint pain and myalgias.  ?Skin:  Negative for itching and rash.  ?Neurological:  Negative for dizziness and headaches.  ?Psychiatric/Behavioral:  Negative for depression. The patient is not nervous/anxious.   ? ? ?Objective: ?BP 122/68   Pulse 76   Temp (!) 97.5 ?F (36.4 ?C) (Temporal)   Ht '5\' 10"'$  (1.778  m)   Wt 175 lb 3.2 oz (79.5 kg)   BMI 25.14 kg/m?  ?Physical Exam ?Constitutional:   ?   Appearance: Normal appearance.  ?HENT:  ?   Head: Normocephalic and atraumatic.  ?Eyes:  ?   Extraocular Movements: Extraocular movements intact.  ?   Conjunctiva/sclera: Conjunctivae normal.  ?Cardiovascular:  ?   Rate and Rhythm: Normal rate and regular rhythm.  ?Pulmonary:  ?   Effort: Pulmonary effort is normal.  ?   Breath sounds: Normal breath sounds.  ?Abdominal:  ?   General:  Bowel sounds are normal.  ?   Palpations: Abdomen is soft.  ?Musculoskeletal:     ?   General: Normal range of motion.  ?   Cervical back: Normal range of motion and neck supple.  ?Skin: ?   General: Skin is warm.  ?Neurological:  ?   General: No focal deficit present.  ?   Mental Status: He is alert and oriented to person, place, and time.  ?Psychiatric:     ?   Mood and Affect: Mood normal.     ?   Behavior: Behavior normal.  ? ? ? ?Assessment: ?*Chronic GERD-well-controlled on pantoprazole ?*Adenomatous colon polyps ? ?Plan: ?GERD well-controlled on pantoprazole.  We will continue. ? ?Agree patient needs surveillance colonoscopy.  Given his significant cardiac history, we will reach out to his cardiologist for clearance prior to scheduling. ? ?Patient will need to hold his Brilinta x5 days prior to procedure.  States his stent was placed in 2018. ? ?We will also request most recent echocardiogram. ? ? ? ?12/18/2021 1:28 PM ? ? ?Disclaimer: This note was dictated with voice recognition software. Similar sounding words can inadvertently be transcribed and may not be corrected upon review. ? ?

## 2021-12-18 NOTE — Patient Instructions (Signed)
We will reach out to Dr. Sabra Heck for cardiac clearance in regards to undergoing colonoscopy. ? ?Once we have heard back from him, we will call you to schedule your colonoscopy. ? ?Continue on pantoprazole for your chronic reflux. ? ?It was very nice meeting you today. ? ?Dr. Abbey Chatters ?

## 2021-12-23 ENCOUNTER — Telehealth: Payer: Self-pay

## 2021-12-23 NOTE — Telephone Encounter (Signed)
Pt cleared to have colonoscopy asa lll by Dr. Orpah Greek.  ? ?Dr. Abbey Chatters will be on your desk to sigh off on ?

## 2021-12-23 NOTE — Telephone Encounter (Signed)
FYI: ? ?Please disregard pt being cleared for colonoscopy. Call from [place] at Dr. Ammie Ferrier office advises that the pt had his records sent to his new Cardiologist. So therefore I have to find out who this is and do another cardiac clearance. ?

## 2021-12-24 NOTE — Telephone Encounter (Signed)
Noted, will await clearance. ?

## 2022-01-08 DIAGNOSIS — Z9889 Other specified postprocedural states: Secondary | ICD-10-CM

## 2022-01-08 HISTORY — DX: Other specified postprocedural states: Z98.890

## 2022-01-12 NOTE — Telephone Encounter (Signed)
Have you seen anything on pt regarding getting clearance

## 2022-01-13 NOTE — Telephone Encounter (Signed)
Phoned the pt and advised that I needed the name of his new Cardiologist. Pt states he will be seeing Dr. Raechel Chute today @ 2pm. Sent a cardiac clearance letter to [facility] in [place] for the pt's Dr. To respond to. Waiting on response.

## 2022-01-14 NOTE — Telephone Encounter (Signed)
Documentation from [facility] regarding clearing the pt for his procedure. Scanned into the pts chart under media

## 2022-01-14 NOTE — Telephone Encounter (Signed)
Cleared to hold Brilinta.  Okay to continue baby aspirin throughout.  Okay to schedule.  Thank you

## 2022-01-15 ENCOUNTER — Encounter: Payer: Self-pay | Admitting: *Deleted

## 2022-01-15 MED ORDER — PEG 3350-KCL-NA BICARB-NACL 420 G PO SOLR: ORAL | 0 refills | Status: DC

## 2022-01-15 NOTE — Telephone Encounter (Signed)
Spoke with pt. He has been scheduled for TCS with Dr. Abbey Chatters, ASA3 on 7/13 at 8am. Aware needs to hold brillinta 5 days prior. Will mail prep instructions/pre-op. Rx sent.   PA approved via cohere. Auth#

## 2022-01-15 NOTE — Addendum Note (Signed)
Addended by: Cheron Every on: 01/15/2022 08:35 AM   Modules accepted: Orders

## 2022-02-16 NOTE — Patient Instructions (Signed)
JAZIER MCGLAMERY  02/16/2022     '@PREFPERIOPPHARMACY'$ @   Your procedure is scheduled on  02/19/2022.   Report to [facility] at  [phone]  A.M.   Call this number if you have problems the morning of surgery:  [phone]   Remember:  Follow the diet and prep instructions given to you by the office.     Take these medicines the morning of surgery with A SIP OF WATER                     coreg, isordil, protonix, entresto.     Do not wear jewelry, make-up or nail polish.  Do not wear lotions, powders, or perfumes, or deodorant.  Do not shave 48 hours prior to surgery.  Men may shave face and neck.  Do not bring valuables to the hospital.  [facility] is not responsible for any belongings or valuables.  Contacts, dentures or bridgework may not be worn into surgery.  Leave your suitcase in the car.  After surgery it may be brought to your room.  For patients admitted to the hospital, discharge time will be determined by your treatment team.  Patients discharged the day of surgery will not be allowed to drive home and must have someone with them for 24 hours.    Special instructions:   DO NOT smoke tobacco or vape for 24 hours before your procedure.  Please read over the following fact sheets that you were given. Anesthesia Post-op Instructions and Care and Recovery After Surgery      Colonoscopy, Adult, Care After The following information offers guidance on how to care for yourself after your procedure. Your health care provider may also give you more specific instructions. If you have problems or questions, contact your health care provider. What can I expect after the procedure? After the procedure, it is common to have: A small amount of blood in your stool for 24 hours after the procedure. Some gas. Mild cramping or bloating of your abdomen. Follow these instructions at home: Eating and drinking  Drink enough fluid to keep your urine pale yellow. Follow  instructions from your health care provider about eating or drinking restrictions. Resume your normal diet as told by your health care provider. Avoid heavy or fried foods that are hard to digest. Activity Rest as told by your health care provider. Avoid sitting for a long time without moving. Get up to take short walks every 1-2 hours. This is important to improve blood flow and breathing. Ask for help if you feel weak or unsteady. Return to your normal activities as told by your health care provider. Ask your health care provider what activities are safe for you. Managing cramping and bloating  Try walking around when you have cramps or feel bloated. If directed, apply heat to your abdomen as told by your health care provider. Use the heat source that your health care provider recommends, such as a moist heat pack or a heating pad. Place a towel between your skin and the heat source. Leave the heat on for 20-30 minutes. Remove the heat if your skin turns bright red. This is especially important if you are unable to feel pain, heat, or cold. You have a greater risk of getting burned. General instructions If you were given a sedative during the procedure, it can affect you for several hours. Do not drive or operate machinery until your health care provider says  that it is safe. For the first 24 hours after the procedure: Do not sign important documents. Do not drink alcohol. Do your regular daily activities at a slower pace than normal. Eat soft foods that are easy to digest. Take over-the-counter and prescription medicines only as told by your health care provider. Keep all follow-up visits. This is important. Contact a health care provider if: You have blood in your stool 2-3 days after the procedure. Get help right away if: You have more than a small spotting of blood in your stool. You have large blood clots in your stool. You have swelling of your abdomen. You have nausea or  vomiting. You have a fever. You have increasing pain in your abdomen that is not relieved with medicine. These symptoms may be an emergency. Get help right away. Call 911. Do not wait to see if the symptoms will go away. Do not drive yourself to the hospital. Summary After the procedure, it is common to have a small amount of blood in your stool. You may also have mild cramping and bloating of your abdomen. If you were given a sedative during the procedure, it can affect you for several hours. Do not drive or operate machinery until your health care provider says that it is safe. Get help right away if you have a lot of blood in your stool, nausea or vomiting, a fever, or increased pain in your abdomen. This information is not intended to replace advice given to you by your health care provider. Make sure you discuss any questions you have with your health care provider. Document Revised: 03/19/2021 Document Reviewed: 03/19/2021 Elsevier Patient Education  [place] After This sheet gives you information about how to care for yourself after your procedure. Your health care provider may also give you more specific instructions. If you have problems or questions, contact your health care provider. What can I expect after the procedure? After the procedure, it is common to have: Tiredness. Forgetfulness about what happened after the procedure. Impaired judgment for important decisions. Nausea or vomiting. Some difficulty with balance. Follow these instructions at home: For the time period you were told by your health care provider:     Rest as needed. Do not participate in activities where you could fall or become injured. Do not drive or use machinery. Do not drink alcohol. Do not take sleeping pills or medicines that cause drowsiness. Do not make important decisions or sign legal documents. Do not take care of children on your own. Eating and  drinking Follow the diet that is recommended by your health care provider. Drink enough fluid to keep your urine pale yellow. If you vomit: Drink water, juice, or soup when you can drink without vomiting. Make sure you have little or no nausea before eating solid foods. General instructions Have a responsible adult stay with you for the time you are told. It is important to have someone help care for you until you are awake and alert. Take over-the-counter and prescription medicines only as told by your health care provider. If you have sleep apnea, surgery and certain medicines can increase your risk for breathing problems. Follow instructions from your health care provider about wearing your sleep device: Anytime you are sleeping, including during daytime naps. While taking prescription pain medicines, sleeping medicines, or medicines that make you drowsy. Avoid smoking. Keep all follow-up visits as told by your health care provider. This is important. Contact a health  care provider if: You keep feeling nauseous or you keep vomiting. You feel light-headed. You are still sleepy or having trouble with balance after 24 hours. You develop a rash. You have a fever. You have redness or swelling around the IV site. Get help right away if: You have trouble breathing. You have new-onset confusion at home. Summary For several hours after your procedure, you may feel tired. You may also be forgetful and have poor judgment. Have a responsible adult stay with you for the time you are told. It is important to have someone help care for you until you are awake and alert. Rest as told. Do not drive or operate machinery. Do not drink alcohol or take sleeping pills. Get help right away if you have trouble breathing, or if you suddenly become confused. This information is not intended to replace advice given to you by your health care provider. Make sure you discuss any questions you have with your  health care provider. Document Revised: 07/01/2021 Document Reviewed: 06/29/2019 Elsevier Patient Education  [place].

## 2022-02-17 ENCOUNTER — Encounter (HOSPITAL_COMMUNITY)
Admission: RE | Admit: 2022-02-17 | Discharge: 2022-02-17 | Disposition: A | Discharge status: Home / Self Care | Payer: Medicare PPO | Source: Ambulatory Visit | Attending: Internal Medicine | Admitting: Internal Medicine

## 2022-02-17 ENCOUNTER — Other Ambulatory Visit: Payer: Self-pay

## 2022-02-17 ENCOUNTER — Encounter (HOSPITAL_COMMUNITY): Payer: Self-pay

## 2022-02-17 VITALS — BP 129/76 | HR 66 | Temp 97.8°F | Resp 18 | Ht 70.0 in | Wt 175.3 lb

## 2022-02-17 DIAGNOSIS — Z01810 Encounter for preprocedural cardiovascular examination: Secondary | ICD-10-CM | POA: Insufficient documentation

## 2022-02-17 DIAGNOSIS — I10 Essential (primary) hypertension: Secondary | ICD-10-CM | POA: Diagnosis not present

## 2022-02-19 ENCOUNTER — Encounter (HOSPITAL_COMMUNITY)
Admission: RE | Disposition: A | Discharge status: Home / Self Care | Payer: Self-pay | Source: Home / Self Care | Attending: Internal Medicine

## 2022-02-19 ENCOUNTER — Ambulatory Visit (HOSPITAL_BASED_OUTPATIENT_CLINIC_OR_DEPARTMENT_OTHER): Payer: Medicare PPO | Admitting: Anesthesiology

## 2022-02-19 ENCOUNTER — Ambulatory Visit (HOSPITAL_COMMUNITY): Payer: Medicare PPO | Admitting: Anesthesiology

## 2022-02-19 ENCOUNTER — Ambulatory Visit (HOSPITAL_COMMUNITY)
Admission: RE | Admit: 2022-02-19 | Discharge: 2022-02-19 | Disposition: A | Discharge status: Home / Self Care | Payer: Medicare PPO | Attending: Internal Medicine | Admitting: Internal Medicine

## 2022-02-19 ENCOUNTER — Encounter (HOSPITAL_COMMUNITY): Payer: Self-pay

## 2022-02-19 DIAGNOSIS — K635 Polyp of colon: Secondary | ICD-10-CM | POA: Insufficient documentation

## 2022-02-19 DIAGNOSIS — D122 Benign neoplasm of ascending colon: Secondary | ICD-10-CM | POA: Diagnosis not present

## 2022-02-19 DIAGNOSIS — K219 Gastro-esophageal reflux disease without esophagitis: Secondary | ICD-10-CM | POA: Insufficient documentation

## 2022-02-19 DIAGNOSIS — Z86010 Personal history of colonic polyps: Secondary | ICD-10-CM

## 2022-02-19 DIAGNOSIS — I2510 Atherosclerotic heart disease of native coronary artery without angina pectoris: Secondary | ICD-10-CM | POA: Insufficient documentation

## 2022-02-19 DIAGNOSIS — Z87891 Personal history of nicotine dependence: Secondary | ICD-10-CM | POA: Insufficient documentation

## 2022-02-19 DIAGNOSIS — J45909 Unspecified asthma, uncomplicated: Secondary | ICD-10-CM | POA: Insufficient documentation

## 2022-02-19 DIAGNOSIS — I252 Old myocardial infarction: Secondary | ICD-10-CM | POA: Insufficient documentation

## 2022-02-19 DIAGNOSIS — I10 Essential (primary) hypertension: Secondary | ICD-10-CM | POA: Insufficient documentation

## 2022-02-19 DIAGNOSIS — D123 Benign neoplasm of transverse colon: Secondary | ICD-10-CM

## 2022-02-19 DIAGNOSIS — Z800 Family history of malignant neoplasm of digestive organs: Secondary | ICD-10-CM | POA: Insufficient documentation

## 2022-02-19 DIAGNOSIS — K5730 Diverticulosis of large intestine without perforation or abscess without bleeding: Secondary | ICD-10-CM | POA: Insufficient documentation

## 2022-02-19 DIAGNOSIS — Z09 Encounter for follow-up examination after completed treatment for conditions other than malignant neoplasm: Secondary | ICD-10-CM | POA: Diagnosis not present

## 2022-02-19 HISTORY — PX: COLONOSCOPY WITH PROPOFOL: SHX5780

## 2022-02-19 HISTORY — PX: POLYPECTOMY: SHX149

## 2022-02-19 SURGERY — COLONOSCOPY WITH PROPOFOL
Anesthesia: General

## 2022-02-19 MED ORDER — LIDOCAINE HCL (CARDIAC) PF 100 MG/5ML IV SOSY
PREFILLED_SYRINGE | INTRAVENOUS | Status: DC | PRN
  Administered 2022-02-19: 60 mg via INTRATRACHEAL

## 2022-02-19 MED ORDER — LACTATED RINGERS IV SOLN: INTRAVENOUS | Status: DC | PRN

## 2022-02-19 MED ORDER — PHENYLEPHRINE HCL (PRESSORS) 10 MG/ML IV SOLN
INTRAVENOUS | Status: DC | PRN
  Administered 2022-02-19: 160 mg via INTRAVENOUS

## 2022-02-19 MED ORDER — PROPOFOL 500 MG/50ML IV EMUL
INTRAVENOUS | Status: DC | PRN
  Administered 2022-02-19: 150 ug/kg/min via INTRAVENOUS

## 2022-02-19 MED ORDER — PROPOFOL 10 MG/ML IV BOLUS
INTRAVENOUS | Status: DC | PRN
  Administered 2022-02-19: 100 mg via INTRAVENOUS

## 2022-02-19 NOTE — Discharge Instructions (Addendum)
  Colonoscopy Discharge Instructions  Read the instructions outlined below and refer to this sheet in the next few weeks. These discharge instructions provide you with general information on caring for yourself after you leave the hospital. Your doctor may also give you specific instructions. While your treatment has been planned according to the most current medical practices available, unavoidable complications occasionally occur.   ACTIVITY You may resume your regular activity, but move at a slower pace for the next 24 hours.  Take frequent rest periods for the next 24 hours.  Walking will help get rid of the air and reduce the bloated feeling in your belly (abdomen).  No driving for 24 hours (because of the medicine (anesthesia) used during the test).   Do not sign any important legal documents or operate any machinery for 24 hours (because of the anesthesia used during the test).  NUTRITION Drink plenty of fluids.  You may resume your normal diet as instructed by your doctor.  Begin with a light meal and progress to your normal diet. Heavy or fried foods are harder to digest and may make you feel sick to your stomach (nauseated).  Avoid alcoholic beverages for 24 hours or as instructed.  MEDICATIONS You may resume your normal medications unless your doctor tells you otherwise.  WHAT YOU CAN EXPECT TODAY Some feelings of bloating in the abdomen.  Passage of more gas than usual.  Spotting of blood in your stool or on the toilet paper.  IF YOU HAD POLYPS REMOVED DURING THE COLONOSCOPY: No aspirin products for 7 days or as instructed.  No alcohol for 7 days or as instructed.  Eat a soft diet for the next 24 hours.  FINDING OUT THE RESULTS OF YOUR TEST Not all test results are available during your visit. If your test results are not back during the visit, make an appointment with your caregiver to find out the results. Do not assume everything is normal if you have not heard from your  caregiver or the medical facility. It is important for you to follow up on all of your test results.  SEEK IMMEDIATE MEDICAL ATTENTION IF: You have more than a spotting of blood in your stool.  Your belly is swollen (abdominal distention).  You are nauseated or vomiting.  You have a temperature over 101.  You have abdominal pain or discomfort that is severe or gets worse throughout the day.   Your colonoscopy revealed 5 polyp(s) which I removed successfully. Await pathology results, my office will contact you. I recommend repeating colonoscopy in 5 years for surveillance purposes. Otherwise follow up with GI as needed.    I hope you have a great rest of your week!  Elon Alas. Abbey Chatters, D.O. Gastroenterology and Hepatology [facility]

## 2022-02-19 NOTE — Anesthesia Postprocedure Evaluation (Signed)
Anesthesia Post Note  Patient: NORVAL SLAVEN  Procedure(s) Performed: COLONOSCOPY WITH PROPOFOL POLYPECTOMY INTESTINAL  Patient location during evaluation: Phase II Anesthesia Type: General Level of consciousness: awake and alert Pain management: pain level controlled Vital Signs Assessment: post-procedure vital signs reviewed and stable Respiratory status: spontaneous breathing, nonlabored ventilation, respiratory function stable and patient connected to nasal cannula oxygen Cardiovascular status: blood pressure returned to baseline and stable Postop Assessment: no apparent nausea or vomiting Anesthetic complications: no   There were no known notable events for this encounter.   Last Vitals:  Vitals:   02/19/22 0839 02/19/22 0843  BP: 100/62 110/68  Pulse:    Resp:    Temp:    SpO2:      Last Pain:  Vitals:   02/19/22 0843  TempSrc:   PainSc: 0-No pain                 Trixie Rude

## 2022-02-19 NOTE — H&P (Signed)
Primary Care Physician:  Thea Alken Primary Gastroenterologist:  Dr. Abbey Chatters  Pre-Procedure History & Physical: HPI:  Harry Haynes is a 76 y.o. male is here  for a colonoscopy to be performed for surveillance purposes.  Last colonoscopy 2020 with 5 tubular adenomas removed.  Also notes family history of colon cancer in his brother.  No melena hematochezia.  No abdominal pain.  No unintentional weight loss.  No change in bowel habits.  Past Medical History:  Diagnosis Date   Asthma    Coronary artery disease    GERD (gastroesophageal reflux disease)    History of cardiac catheterization 01/2022   Hypercholesterolemia    Hypertension    MI (myocardial infarction) ([facility]) 2006    Past Surgical History:  Procedure Laterality Date   BIOPSY  09/22/2019   Procedure: BIOPSY;  Surgeon: Danie Binder, MD;  [facility]: [facility];  Service: Endoscopy;;   BUNIONECTOMY     CARDIAC CATHETERIZATION     with stents. started on brilinta    CATARACT EXTRACTION Bilateral 10/2020   COLONOSCOPY  03/2014   Dr. West Carbo: normal without polyps. No significant diverticulosis   COLONOSCOPY  2010   Dr. Algis Greenhouse: 7-8 mm polyp, tubular adenoma   COLONOSCOPY N/A 08/19/2018   Dr. Gala Romney: five 4-6 mm polyps in ascending colon s/p resection, diverticulosis, tubular adenomas. Due for surveillance in 3 years   CORONARY ARTERY BYPASS GRAFT  2006   triple vessel    ESOPHAGOGASTRODUODENOSCOPY  03/2014   Dr. West Carbo: 7cm hiatal hernia, lower esophageal ring s/p 44 Maloney    ESOPHAGOGASTRODUODENOSCOPY N/A 09/22/2019   Small hiatal hernia, multiple gastric polyps s/p resection, moderate gastritis s/p biopsy. Benign polyps. Negative H.pylori.    HAMMER TOE SURGERY     POLYPECTOMY  08/19/2018   Procedure: POLYPECTOMY;  Surgeon: Daneil Dolin, MD;  Location: [facility];  Service: Endoscopy;;  ascending colon(csx5)   right inguinal hernia  1980s    Prior to Admission medications   Medication Sig Start  Date End Date Taking? Authorizing Provider  aspirin EC 81 MG tablet Take 81 mg by mouth every other day.    Yes [provider]  carvedilol (COREG) 6.25 MG tablet Take 3.125 mg by mouth 2 (two) times daily with a meal.   Yes [provider]  fluticasone (FLONASE) 50 MCG/ACT nasal spray Place 1-2 sprays into both nostrils daily.   Yes [provider]  isosorbide dinitrate (ISORDIL) 20 MG tablet Take 20 mg by mouth 2 (two) times daily. 06/06/18  Yes [provider]  montelukast (SINGULAIR) 10 MG tablet Take 10 mg by mouth at bedtime.   Yes [provider]  pantoprazole (PROTONIX) 40 MG tablet Take 1 tablet (40 mg total) by mouth daily before breakfast. 11/25/21  Yes Erenest Rasher, PA-C  Probiotic Product (ALIGN PO) Take 1 capsule by mouth daily.    Yes [provider]  psyllium (METAMUCIL) 58.6 % powder Take 1 packet by mouth daily.    Yes [provider]  rosuvastatin (CRESTOR) 10 MG tablet Take 10 mg by mouth every evening.    Yes [provider]  sacubitril-valsartan (ENTRESTO) 49-51 MG Take 1 tablet by mouth 2 (two) times daily.   Yes [provider]  Donnal Debar 200-62.5-25 MCG/ACT AEPB Inhale 1 puff into the lungs daily. 11/24/21  Yes [provider]  nitroGLYCERIN (NITROSTAT) 0.4 MG SL tablet Place 0.4 mg under the tongue every 5 (five) minutes as needed for chest  pain.    [provider]  polyethylene glycol-electrolytes (NULYTELY) 420 g solution As directed 01/15/22   Eloise Harman, DO    Allergies as of 01/15/2022   (No Known Allergies)    Family History  Problem Relation Age of Onset   Colon cancer Brother        diagnosed at 69, deceased   Lung cancer Brother     Social History   Socioeconomic History   Marital status: Divorced    Spouse name: Not on file   Number of children: Not on file   Years of education: Not on file   Highest education level: Not on file   Occupational History   Not on file  Tobacco Use   Smoking status: Former    Types: Cigarettes    Quit date: 05/08/1971    Years since quitting: 50.8   Smokeless tobacco: Never  Vaping Use   Vaping Use: Not on file  Substance and Sexual Activity   Alcohol use: Not Currently    Alcohol/week: 1.0 standard drink of alcohol    Types: 1 Glasses of wine per week    Comment: 02/12/21   Drug use: No   Sexual activity: Not on file  Other Topics Concern   Not on file  Social History Narrative   Not on file   Social Determinants of Health   Financial Resource Strain: Not on file  Food Insecurity: Not on file  Transportation Needs: Not on file  Physical Activity: Not on file  Stress: Not on file  Social Connections: Not on file  Intimate Partner Violence: Not on file    Review of Systems: See HPI, otherwise negative ROS  Physical Exam: Vital signs in last 24 hours: Temp:  [97.9 F (36.6 C)] 97.9 F (36.6 C) (07/13 0716) Pulse Rate:  [59] 59 (07/13 0716) Resp:  [19] 19 (07/13 0716) BP: (135)/(94) 135/94 (07/13 0716) SpO2:  [98 %] 98 % (07/13 0716) Weight:  [79.5 kg] 79.5 kg (07/13 0716)   General:   Alert,  Well-developed, well-nourished, pleasant and cooperative in NAD Head:  Normocephalic and atraumatic. Eyes:  Sclera clear, no icterus.   Conjunctiva pink. Ears:  Normal auditory acuity. Nose:  No deformity, discharge,  or lesions. Mouth:  No deformity or lesions, dentition normal. Neck:  Supple; no masses or thyromegaly. Lungs:  Clear throughout to auscultation.   No wheezes, crackles, or rhonchi. No acute distress. Heart:  Regular rate and rhythm; no murmurs, clicks, rubs,  or gallops. Abdomen:  Soft, nontender and nondistended. No masses, hepatosplenomegaly or hernias noted. Normal bowel sounds, without guarding, and without rebound.   Msk:  Symmetrical without gross deformities. Normal posture. Extremities:  Without clubbing or edema. Neurologic:  Alert and  oriented  x4;  grossly normal neurologically. Skin:  Intact without significant lesions or rashes. Cervical Nodes:  No significant cervical adenopathy. Psych:  Alert and cooperative. Normal mood and affect.  Impression/Plan: Harry Haynes is here for a colonoscopy to be performed for surveillance purposes.  Last colonoscopy 2020 with 5 tubular adenomas removed.  Also notes family history of colon cancer in his brother.  No melena hematochezia.  No abdominal pain.  No unintentional weight loss.  No change in bowel habits.  The risks of the procedure including infection, bleed, or perforation as well as benefits, limitations, alternatives and imponderables have been reviewed with the patient. Questions have been answered. All parties agreeable.

## 2022-02-19 NOTE — Op Note (Signed)
[facility] Patient Name: Harry Haynes Procedure Date: 02/19/2022 8:10 AM MRN: [id number] Date of Birth: 1946-08-01 Attending MD: Elon Alas. Abbey Chatters DO CSN: [id number] Age: 76 Admit Type: Outpatient Procedure:                Colonoscopy Indications:              Surveillance: Personal history of adenomatous                            polyps on last colonoscopy 3 years ago Providers:                Elon Alas. Abbey Chatters, DO, Crystal Page, Everardo Pacific, Randa Spike, Technician Referring MD:              Medicines:                See the Anesthesia note for documentation of the                            administered medications Complications:            No immediate complications. Estimated Blood Loss:     Estimated blood loss was minimal. Procedure:                Pre-Anesthesia Assessment:                           - The anesthesia plan was to use monitored                            anesthesia care (MAC).                           After obtaining informed consent, the colonoscope                            was passed under direct vision. Throughout the                            procedure, the patient's blood pressure, pulse, and                            oxygen saturations were monitored continuously. The                            PCF-HQ190L ([id number]) was introduced through the                            anus and advanced to the the cecum, identified by                            appendiceal orifice and ileocecal valve. The                            colonoscopy was performed without difficulty.  The                            patient tolerated the procedure well. The quality                            of the bowel preparation was evaluated using the                            BBPS [facility] Bowel Preparation Scale) with scores                            of: Right Colon = 3, Transverse Colon = 3 and Left                            Colon = 3 (entire  mucosa seen well with no residual                            staining, small fragments of stool or opaque                            liquid). The total BBPS score equals 9. Scope In: 5:63:89 AM Scope Out: 8:30:55 AM Scope Withdrawal Time: 0 hours 13 minutes 24 seconds  Total Procedure Duration: 0 hours 14 minutes 36 seconds  Findings:      The perianal and digital rectal examinations were normal.      A few small-mouthed diverticula were found in the sigmoid colon.      A 5 mm polyp was found in the ascending colon. The polyp was sessile.       The polyp was removed with a cold snare. Resection and retrieval were       complete.      A 2 mm polyp was found in the transverse colon. The polyp was sessile.       The polyp was removed with a cold biopsy forceps. Resection and       retrieval were complete.      Two sessile polyps were found in the transverse colon. The polyps were 4       to 6 mm in size. These polyps were removed with a cold snare. Resection       and retrieval were complete.      A 2 mm polyp was found in the descending colon. The polyp was sessile.       The polyp was removed with a cold biopsy forceps. Resection and       retrieval were complete.      The exam was otherwise without abnormality. Impression:               - Diverticulosis in the sigmoid colon.                           - One 5 mm polyp in the ascending colon, removed                            with a cold snare. Resected and retrieved.                           -  One 2 mm polyp in the transverse colon, removed                            with a cold biopsy forceps. Resected and retrieved.                           - Two 4 to 6 mm polyps in the transverse colon,                            removed with a cold snare. Resected and retrieved.                           - One 2 mm polyp in the descending colon, removed                            with a cold biopsy forceps. Resected and retrieved.                            - The examination was otherwise normal. Moderate Sedation:      Per Anesthesia Care Recommendation:           - Patient has a contact number available for                            emergencies. The signs and symptoms of potential                            delayed complications were discussed with the                            patient. Return to normal activities tomorrow.                            Written discharge instructions were provided to the                            patient.                           - Resume previous diet.                           - Continue present medications.                           - Await pathology results.                           - Repeat colonoscopy in 5 years for surveillance.                           - Return to GI clinic PRN. Procedure Code(s):        --- Professional ---  45385, Colonoscopy, flexible; with removal of                            tumor(s), polyp(s), or other lesion(s) by snare                            technique                           45380, 73, Colonoscopy, flexible; with biopsy,                            single or multiple Diagnosis Code(s):        --- Professional ---                           K63.5, Polyp of colon                           Z86.010, Personal history of colonic polyps                           K57.30, Diverticulosis of large intestine without                            perforation or abscess without bleeding CPT copyright 2019 American Medical Association. All rights reserved. The codes documented in this report are preliminary and upon coder review may  be revised to meet current compliance requirements. Elon Alas. Abbey Chatters, DO [place] Abbey Chatters, DO 02/19/2022 8:34:31 AM This report has been signed electronically. Number of Addenda: 0

## 2022-02-19 NOTE — Anesthesia Preprocedure Evaluation (Signed)
Anesthesia Evaluation  Patient identified by MRN, date of birth, ID band Patient awake    Reviewed: Allergy & Precautions, NPO status , Patient's Chart, lab work & pertinent test results  Airway Mallampati: II  TM Distance: >3 FB Neck ROM: Full    Dental no notable dental hx.    Pulmonary asthma , former smoker,    Pulmonary exam normal        Cardiovascular hypertension, + CAD and + Past MI  Normal cardiovascular exam     Neuro/Psych negative neurological ROS     GI/Hepatic Neg liver ROS, GERD  ,  Endo/Other  negative endocrine ROS  Renal/GU negative Renal ROS     Musculoskeletal negative musculoskeletal ROS (+)   Abdominal Normal abdominal exam  (+)   Peds  Hematology negative hematology ROS (+)   Anesthesia Other Findings   Reproductive/Obstetrics                             Anesthesia Physical Anesthesia Plan  ASA: 3  Anesthesia Plan: General   Post-op Pain Management:    Induction: Intravenous  PONV Risk Score and Plan: 2 and Propofol infusion and TIVA  Airway Management Planned: Natural Airway and Nasal Cannula  Additional Equipment:   Intra-op Plan:   Post-operative Plan:   Informed Consent: I have reviewed the patients History and Physical, chart, labs and discussed the procedure including the risks, benefits and alternatives for the proposed anesthesia with the patient or authorized representative who has indicated his/her understanding and acceptance.     Dental advisory given  Plan Discussed with: CRNA  Anesthesia Plan Comments:         Anesthesia Quick Evaluation

## 2022-02-19 NOTE — Transfer of Care (Signed)
Immediate Anesthesia Transfer of Care Note  Patient: Harry Haynes  Procedure(s) Performed: COLONOSCOPY WITH PROPOFOL POLYPECTOMY INTESTINAL  Patient Location: Short Stay  Anesthesia Type:General  Level of Consciousness: sedated  Airway & Oxygen Therapy: Patient Spontanous Breathing  Post-op Assessment: Report given to RN and Post -op Vital signs reviewed and stable  Post vital signs: Reviewed and stable  Last Vitals:  Vitals Value Taken Time  BP    Temp    Pulse    Resp    SpO2      Last Pain:  Vitals:   02/19/22 0813  TempSrc:   PainSc: 0-No pain         Complications: No notable events documented.

## 2022-02-20 LAB — SURGICAL PATHOLOGY

## 2022-02-25 ENCOUNTER — Encounter (HOSPITAL_COMMUNITY): Payer: Self-pay | Admitting: Internal Medicine

## 2022-06-05 ENCOUNTER — Other Ambulatory Visit: Payer: Self-pay | Admitting: Gastroenterology

## 2022-06-05 DIAGNOSIS — K219 Gastro-esophageal reflux disease without esophagitis: Secondary | ICD-10-CM

## 2023-08-20 ENCOUNTER — Other Ambulatory Visit: Payer: Self-pay | Admitting: Internal Medicine

## 2023-08-20 DIAGNOSIS — K219 Gastro-esophageal reflux disease without esophagitis: Secondary | ICD-10-CM

## 2023-08-20 NOTE — Telephone Encounter (Signed)
 Patient has been scheduled

## 2023-08-20 NOTE — Telephone Encounter (Signed)
 Pt needs ov before any further refills

## 2023-09-01 ENCOUNTER — Encounter: Payer: Self-pay | Admitting: Internal Medicine

## 2023-09-01 ENCOUNTER — Ambulatory Visit (INDEPENDENT_AMBULATORY_CARE_PROVIDER_SITE_OTHER): Payer: Medicare PPO | Admitting: Internal Medicine

## 2023-09-01 VITALS — BP 143/82 | HR 71 | Temp 98.0°F | Ht 70.0 in | Wt 171.9 lb

## 2023-09-01 DIAGNOSIS — K219 Gastro-esophageal reflux disease without esophagitis: Secondary | ICD-10-CM | POA: Diagnosis not present

## 2023-09-01 DIAGNOSIS — K921 Melena: Secondary | ICD-10-CM

## 2023-09-01 DIAGNOSIS — R208 Other disturbances of skin sensation: Secondary | ICD-10-CM

## 2023-09-01 DIAGNOSIS — Z860101 Personal history of adenomatous and serrated colon polyps: Secondary | ICD-10-CM

## 2023-09-01 LAB — CBC
HCT: 45.2 % (ref 38.5–50.0)
Hemoglobin: 14.8 g/dL (ref 13.2–17.1)
MCH: 28.4 pg (ref 27.0–33.0)
MCHC: 32.7 g/dL (ref 32.0–36.0)
MCV: 86.8 fL (ref 80.0–100.0)
MPV: 12.6 fL — ABNORMAL HIGH (ref 7.5–12.5)
Platelets: 165 10*3/uL (ref 140–400)
RBC: 5.21 10*6/uL (ref 4.20–5.80)
RDW: 12.7 % (ref 11.0–15.0)
WBC: 4.6 10*3/uL (ref 3.8–10.8)

## 2023-09-01 MED ORDER — PANTOPRAZOLE SODIUM 40 MG PO TBEC: 40.0000 mg | DELAYED_RELEASE_TABLET | Freq: Every day | ORAL | 3 refills | Status: DC

## 2023-09-01 NOTE — Progress Notes (Signed)
Referring Provider: Garald Braver, FNP Primary Care Physician:  Garald Braver Primary GI:  Dr. Marletta Lor  Chief Complaint  Patient presents with   Abdominal Pain    Patient here today due to having some mid abdominal pain, on going for the last week or so. Patient also says he has seen some darker stools with his last few bms, which in the last day or so seems lighter in color. He is taking pantoprazole 40 mg daily, he will need refills on this today.    HPI:   Harry Haynes is a 78 y.o. male who presents to the clinic today for follow-up visit.    Chronic GERD: Historically well-controlled on pantoprazole daily.  Requesting refills today.  He states approximately 1 week ago he had sudden onset of epigastric burning and hoarseness which is usually signs of flare of his reflux.  Does note 2 days of dark stools which have since normalized.  No NSAID use.  Chronically on Brilinta.  States his abdominal pain is improving.  Does note taking Pepto-Bismol during this time.  No dysphagia odynophagia.  EGD 09/22/2019-normal esophagus, small hiatal hernia, multiple small polyps in the gastric fundus and body.  Gastritis, normal duodenum.  Pathology showed fundic gland polyps removed, gastric biopsies negative for H. pylori.  Adenomatous colon polyps: Colonoscopy 02/19/2022 with 5 small tubular adenomas.  Colonoscopy recall 3 years if benefits outweigh risk.  .  Past Medical History:  Diagnosis Date   Asthma    Coronary artery disease    GERD (gastroesophageal reflux disease)    History of cardiac catheterization 01/2022   Hypercholesterolemia    Hypertension    MI (myocardial infarction) ([facility]) 2006    Past Surgical History:  Procedure Laterality Date   BIOPSY  09/22/2019   Procedure: BIOPSY;  Surgeon: West Bali, MD;  [facility]: [facility];  Service: Endoscopy;;   BUNIONECTOMY     CARDIAC CATHETERIZATION     with stents. started on brilinta    CATARACT EXTRACTION Bilateral  10/2020   COLONOSCOPY  03/2014   Dr. Aleene Davidson: normal without polyps. No significant diverticulosis   COLONOSCOPY  2010   Dr. Elder Cyphers: 7-8 mm polyp, tubular adenoma   COLONOSCOPY N/A 08/19/2018   Dr. Jena Gauss: five 4-6 mm polyps in ascending colon s/p resection, diverticulosis, tubular adenomas. Due for surveillance in 3 years   COLONOSCOPY WITH PROPOFOL N/A 02/19/2022   Procedure: COLONOSCOPY WITH PROPOFOL;  Surgeon: Lanelle Bal, DO;  [facility]: [facility];  Service: Endoscopy;  Laterality: N/A;  8:00am, asa 3   CORONARY ARTERY BYPASS GRAFT  2006   triple vessel    ESOPHAGOGASTRODUODENOSCOPY  03/2014   Dr. Aleene Davidson: 7cm hiatal hernia, lower esophageal ring s/p 54 Maloney    ESOPHAGOGASTRODUODENOSCOPY N/A 09/22/2019   Small hiatal hernia, multiple gastric polyps s/p resection, moderate gastritis s/p biopsy. Benign polyps. Negative H.pylori.    HAMMER TOE SURGERY     POLYPECTOMY  08/19/2018   Procedure: POLYPECTOMY;  Surgeon: Corbin Ade, MD;  [facility]: [facility];  Service: Endoscopy;;  ascending colon(csx5)   POLYPECTOMY  02/19/2022   Procedure: POLYPECTOMY INTESTINAL;  Surgeon: Lanelle Bal, DO;  [facility]: [facility];  Service: Endoscopy;;   right inguinal hernia  1980s    Current Outpatient Medications  Medication Sig Dispense Refill   albuterol (VENTOLIN HFA) 108 (90 Base) MCG/ACT inhaler Inhale 2 puffs into the lungs every 6 (six) hours as needed for wheezing or shortness of  breath.     aspirin EC 81 MG tablet Take 81 mg by mouth every other day.      azelastine (ASTELIN) 0.1 % nasal spray Place 1 spray into both nostrils daily at 6 (six) AM. Use in each nostril as directed     carvedilol (COREG) 3.125 MG tablet Take 3.125 mg by mouth 2 (two) times daily with a meal.     dapagliflozin propanediol (FARXIGA) 10 MG TABS tablet Take 10 mg by mouth daily.     fluticasone (FLONASE) 50 MCG/ACT nasal spray Place 1-2 sprays into both nostrils daily.     fluticasone  furoate-vilanterol (BREO ELLIPTA) 200-25 MCG/ACT AEPB Inhale 1 puff into the lungs daily.     isosorbide dinitrate (ISORDIL) 20 MG tablet Take 20 mg by mouth 2 (two) times daily.  0   montelukast (SINGULAIR) 10 MG tablet Take 10 mg by mouth at bedtime.     nitroGLYCERIN (NITROSTAT) 0.4 MG SL tablet Place 0.4 mg under the tongue every 5 (five) minutes as needed for chest pain.     pantoprazole (PROTONIX) 40 MG tablet TAKE 1 TABLET DAILY BEFORE BREAKFAST 30 tablet 0   Probiotic Product (ALIGN PO) Take 1 capsule by mouth daily.      psyllium (METAMUCIL) 58.6 % powder Take 1 packet by mouth daily.      rosuvastatin (CRESTOR) 10 MG tablet Take 10 mg by mouth every evening.      sacubitril-valsartan (ENTRESTO) 49-51 MG Take 1 tablet by mouth 2 (two) times daily.     sodium chloride (OCEAN) 0.65 % SOLN nasal spray Place 1 spray into both nostrils as needed for congestion.     spironolactone (ALDACTONE) 25 MG tablet Take 25 mg by mouth daily.     tamsulosin (FLOMAX) 0.4 MG CAPS capsule Take 0.4 mg by mouth daily.     No current facility-administered medications for this visit.    Allergies as of 09/01/2023   (No Known Allergies)    Family History  Problem Relation Age of Onset   Colon cancer Brother        diagnosed at 26, deceased   Lung cancer Brother     Social History   Socioeconomic History   Marital status: Divorced    Spouse name: Not on file   Number of children: Not on file   Years of education: Not on file   Highest education level: Not on file  Occupational History   Not on file  Tobacco Use   Smoking status: Former    Current packs/day: 0.00    Types: Cigarettes    Quit date: 05/08/1971    Years since quitting: 52.3   Smokeless tobacco: Never  Vaping Use   Vaping status: Not on file  Substance and Sexual Activity   Alcohol use: Not Currently    Alcohol/week: 1.0 standard drink of alcohol    Types: 1 Glasses of wine per week    Comment: 02/12/21   Drug use: No    Sexual activity: Not on file  Other Topics Concern   Not on file  Social History Narrative   Not on file   Social Drivers of Health   Financial Resource Strain: Not on file  Food Insecurity: Not on file  Transportation Needs: Not on file  Physical Activity: Not on file  Stress: Not on file  Social Connections: Not on file    Subjective: Review of Systems  Constitutional:  Negative for chills and fever.  HENT:  Negative for  congestion and hearing loss.   Eyes:  Negative for blurred vision and double vision.  Respiratory:  Negative for cough and shortness of breath.   Cardiovascular:  Negative for chest pain and palpitations.  Gastrointestinal:  Positive for heartburn. Negative for abdominal pain, blood in stool, constipation, diarrhea, melena and vomiting.  Genitourinary:  Negative for dysuria and urgency.  Musculoskeletal:  Negative for joint pain and myalgias.  Skin:  Negative for itching and rash.  Neurological:  Negative for dizziness and headaches.  Psychiatric/Behavioral:  Negative for depression. The patient is not nervous/anxious.      Objective: BP (!) 143/82 (BP Location: Left Arm, Patient Position: Sitting, Cuff Size: Normal)   Pulse 71   Temp 98 F (36.7 C) (Temporal)   Ht 5\' 10"  (1.778 m)   Wt 171 lb 14.4 oz (78 kg)   BMI 24.67 kg/m  Physical Exam Constitutional:      Appearance: Normal appearance.  HENT:     Head: Normocephalic and atraumatic.  Eyes:     Extraocular Movements: Extraocular movements intact.     Conjunctiva/sclera: Conjunctivae normal.  Cardiovascular:     Rate and Rhythm: Normal rate and regular rhythm.  Pulmonary:     Effort: Pulmonary effort is normal.     Breath sounds: Normal breath sounds.  Abdominal:     General: Bowel sounds are normal.     Palpations: Abdomen is soft.  Musculoskeletal:        General: Normal range of motion.     Cervical back: Normal range of motion and neck supple.  Skin:    General: Skin is warm.   Neurological:     General: No focal deficit present.     Mental Status: He is alert and oriented to person, place, and time.  Psychiatric:        Mood and Affect: Mood normal.        Behavior: Behavior normal.      Assessment/Plan:  1.  Chronic GERD-historically well-controlled on pantoprazole.  Flare approximately one week ago which is steadily improving.  Did note 2 days of dark stools which have since improved.  Will check CBC today, if signs of anemia may need to proceed with upper endoscopy.  Patient to call office if symptoms do not improve.  Pantoprazole refilled today.  Avoid NSAIDs.  2.  Adenomatous colon polyps-given his age, likely does not need further colonoscopies for surveillance purposes.  Can discuss 2026 risk versus benefits.  Follow-up in 6 months or sooner if needed.  09/01/2023 2:45 PM   Disclaimer: This note was dictated with voice recognition software. Similar sounding words can inadvertently be transcribed and may not be corrected upon review.

## 2023-09-01 NOTE — Patient Instructions (Signed)
I am going to check CBC at [facility] lab today.  Will call with results.  If your blood counts have dropped, we will call you schedule for upper endoscopy.  Continue on pantoprazole daily.  I have sent in a year supply refills today.  Let us know if your abdominal pain does not improve back to baseline and we can schedule you for upper endoscopy to further evaluate.  Otherwise follow-up in 6 months.  It was very nice seeing you again today.  Dr. Marletta Lor

## 2023-09-13 ENCOUNTER — Other Ambulatory Visit: Payer: Self-pay | Admitting: Internal Medicine

## 2023-09-13 DIAGNOSIS — K219 Gastro-esophageal reflux disease without esophagitis: Secondary | ICD-10-CM

## 2024-02-03 ENCOUNTER — Encounter: Payer: Self-pay | Admitting: Internal Medicine

## 2024-02-14 ENCOUNTER — Telehealth: Payer: Self-pay

## 2024-02-14 NOTE — Telephone Encounter (Signed)
 Pt phoned regarding dark stools and abd pain. Pt stated he was extremely constipated last Thursday and he did a fleet enema (with some relief). The next day his abd was sore and hurting with dark stools. Pt tried to get an appt for today but there wasn't any available spots. Pt spoke of the pain he was in and I advised the pt seek treatment at the ED but pt is wanting to see if there is something that can be called in for him.SABRA advised the pt again if pain is severe and dark stools to go to the ED. Please advise.

## 2024-02-15 NOTE — Telephone Encounter (Signed)
 Pt was scheduled with [facility] for 02/16/24 at 2pm

## 2024-02-15 NOTE — Telephone Encounter (Signed)
 Per Dr. Cindie via secure chat:   Dena sent you a telephone message on this pt, he called back and left vm stating the same. Please advise.   Carlin MARLA Cindie, DO Can he come in for an OV? I took one of Kristen's patients tomorrow, perhaps he can see her CC or Charmaine looks liek she has spots

## 2024-02-15 NOTE — Progress Notes (Unsigned)
 Referring Provider: Viktoria Lawrnce BRAVO, FNP Primary Care Physician:  Viktoria Lawrnce BRAVO Primary GI Physician: Dr. Cindie  No chief complaint on file.   HPI:   Harry Haynes is a 78 y.o. male with history of multiple colon polyps, GERD, dyspepsia, and reported intermittent dark stools, presenting today for further evaluation of abdominal pain, dark stool, constipation.  EGD 09/22/2019-normal esophagus, small hiatal hernia, multiple small polyps in the gastric fundus and body. Gastritis, normal duodenum. Pathology showed fundic gland polyps removed, gastric biopsies negative for H. pylori.   Colonoscopy 02/19/2022 with 5 small tubular adenomas.  Colonoscopy recall 3 years if benefits outweigh risk.   Last seen in the office 09/01/2023 reporting sudden onset epigastric burning, hoarseness, and 2 days of dark stool about 1 week prior to his appointment.  Dark stools normalized.  No abdominal pain improving.  Denied NSAIDs.  Reported taking Pepto-Bismol during that time.  Recommend checking CBC and if signs of anemia, arrange EGD.  CBC showed hemoglobin 14.8.  Patient called the office 7/7 reporting dark stool and abdominal pain.  Reported constipation.  He was scheduled for office visit for further evaluation.    Today:     Past Medical History:  Diagnosis Date   Asthma    Coronary artery disease    GERD (gastroesophageal reflux disease)    History of cardiac catheterization 01/2022   Hypercholesterolemia    Hypertension    MI (myocardial infarction) (HCC) 2006    Past Surgical History:  Procedure Laterality Date   BIOPSY  09/22/2019   Procedure: BIOPSY;  Surgeon: Harvey Margo CROME, MD;  Location: [facility];  Service: Endoscopy;;   BUNIONECTOMY     CARDIAC CATHETERIZATION     with stents. started on brilinta    CATARACT EXTRACTION Bilateral 10/2020   COLONOSCOPY  03/2014   Dr. Rhoda: normal without polyps. No significant diverticulosis   COLONOSCOPY  2010   Dr.  Ivy: 7-8 mm polyp, tubular adenoma   COLONOSCOPY N/A 08/19/2018   Dr. Shaaron: five 4-6 mm polyps in ascending colon s/p resection, diverticulosis, tubular adenomas. Due for surveillance in 3 years   COLONOSCOPY WITH PROPOFOL  N/A 02/19/2022   Procedure: COLONOSCOPY WITH PROPOFOL ;  Surgeon: Cindie Carlin POUR, DO;  Location: [facility];  Service: Endoscopy;  Laterality: N/A;  8:00am, asa 3   CORONARY ARTERY BYPASS GRAFT  2006   triple vessel    ESOPHAGOGASTRODUODENOSCOPY  03/2014   Dr. Rhoda: 7cm hiatal hernia, lower esophageal ring s/p 54 Maloney    ESOPHAGOGASTRODUODENOSCOPY N/A 09/22/2019   Small hiatal hernia, multiple gastric polyps s/p resection, moderate gastritis s/p biopsy. Benign polyps. Negative H.pylori.    HAMMER TOE SURGERY     POLYPECTOMY  08/19/2018   Procedure: POLYPECTOMY;  Surgeon: Shaaron Lamar HERO, MD;  Location: [facility];  Service: Endoscopy;;  ascending colon(csx5)   POLYPECTOMY  02/19/2022   Procedure: POLYPECTOMY INTESTINAL;  Surgeon: Cindie Carlin POUR, DO;  Location: [facility];  Service: Endoscopy;;   right inguinal hernia  1980s    Current Outpatient Medications  Medication Sig Dispense Refill   albuterol (VENTOLIN HFA) 108 (90 Base) MCG/ACT inhaler Inhale 2 puffs into the lungs every 6 (six) hours as needed for wheezing or shortness of breath.     aspirin EC 81 MG tablet Take 81 mg by mouth every other day.      azelastine (ASTELIN) 0.1 % nasal spray Place 1 spray into both nostrils daily at 6 (six) AM. Use  in each nostril as directed     carvedilol (COREG) 3.125 MG tablet Take 3.125 mg by mouth 2 (two) times daily with a meal.     dapagliflozin propanediol (FARXIGA) 10 MG TABS tablet Take 10 mg by mouth daily.     fluticasone (FLONASE) 50 MCG/ACT nasal spray Place 1-2 sprays into both nostrils daily.     fluticasone furoate-vilanterol (BREO ELLIPTA) 200-25 MCG/ACT AEPB Inhale 1 puff into the lungs daily.     isosorbide dinitrate (ISORDIL) 20 MG  tablet Take 20 mg by mouth 2 (two) times daily.  0   montelukast (SINGULAIR) 10 MG tablet Take 10 mg by mouth at bedtime.     nitroGLYCERIN (NITROSTAT) 0.4 MG SL tablet Place 0.4 mg under the tongue every 5 (five) minutes as needed for chest pain.     pantoprazole  (PROTONIX ) 40 MG tablet TAKE 1 TABLET EVERY DAY BEFORE BREAKFAST 30 tablet 1   Probiotic Product (ALIGN PO) Take 1 capsule by mouth daily.      psyllium (METAMUCIL) 58.6 % powder Take 1 packet by mouth daily.      rosuvastatin (CRESTOR) 10 MG tablet Take 10 mg by mouth every evening.      sacubitril-valsartan (ENTRESTO) 49-51 MG Take 1 tablet by mouth 2 (two) times daily.     sodium chloride  (OCEAN) 0.65 % SOLN nasal spray Place 1 spray into both nostrils as needed for congestion.     spironolactone (ALDACTONE) 25 MG tablet Take 25 mg by mouth daily.     tamsulosin (FLOMAX) 0.4 MG CAPS capsule Take 0.4 mg by mouth daily.     No current facility-administered medications for this visit.    Allergies as of 02/16/2024   (No Known Allergies)    Family History  Problem Relation Age of Onset   Colon cancer Brother        diagnosed at 43, deceased   Lung cancer Brother     Social History   Socioeconomic History   Marital status: Divorced    Spouse name: Not on file   Number of children: Not on file   Years of education: Not on file   Highest education level: Not on file  Occupational History   Not on file  Tobacco Use   Smoking status: Former    Current packs/day: 0.00    Types: Cigarettes    Quit date: 05/08/1971    Years since quitting: 52.8   Smokeless tobacco: Never  Vaping Use   Vaping status: Not on file  Substance and Sexual Activity   Alcohol use: Not Currently    Alcohol/week: 1.0 standard drink of alcohol    Types: 1 Glasses of wine per week    Comment: 02/12/21   Drug use: No   Sexual activity: Not on file  Other Topics Concern   Not on file  Social History Narrative   Not on file   Social Drivers of  Health   Financial Resource Strain: Not on file  Food Insecurity: Not on file  Transportation Needs: Not on file  Physical Activity: Not on file  Stress: Not on file  Social Connections: Not on file    Review of Systems: Gen: Denies fever, chills, anorexia. Denies fatigue, weakness, weight loss.  CV: Denies chest pain, palpitations, syncope, peripheral edema, and claudication. Resp: Denies dyspnea at rest, cough, wheezing, coughing up blood, and pleurisy. GI: Denies vomiting blood, jaundice, and fecal incontinence.   Denies dysphagia or odynophagia. Derm: Denies rash, itching, dry skin Psych:  Denies depression, anxiety, memory loss, confusion. No homicidal or suicidal ideation.  Heme: Denies bruising, bleeding, and enlarged lymph nodes.  Physical Exam: There were no vitals taken for this visit. General:   Alert and oriented. No distress noted. Pleasant and cooperative.  Head:  Normocephalic and atraumatic. Eyes:  Conjuctiva clear without scleral icterus. Heart:  S1, S2 present without murmurs appreciated. Lungs:  Clear to auscultation bilaterally. No wheezes, rales, or rhonchi. No distress.  Abdomen:  +BS, soft, non-tender and non-distended. No rebound or guarding. No HSM or masses noted. Msk:  Symmetrical without gross deformities. Normal posture. Extremities:  Without edema. Neurologic:  Alert and  oriented x4 Psych:  Normal mood and affect.    Assessment:     Plan:  ***   [facility], PA-C [facility] 02/16/2024

## 2024-02-16 ENCOUNTER — Ambulatory Visit: Admitting: Gastroenterology

## 2024-02-16 ENCOUNTER — Encounter: Payer: Self-pay | Admitting: Gastroenterology

## 2024-02-16 ENCOUNTER — Telehealth: Payer: Self-pay | Admitting: *Deleted

## 2024-02-16 VITALS — BP 138/88 | HR 80 | Temp 98.0°F | Ht 70.0 in | Wt 170.2 lb

## 2024-02-16 DIAGNOSIS — R1030 Lower abdominal pain, unspecified: Secondary | ICD-10-CM

## 2024-02-16 DIAGNOSIS — R10814 Left lower quadrant abdominal tenderness: Secondary | ICD-10-CM

## 2024-02-16 DIAGNOSIS — K921 Melena: Secondary | ICD-10-CM

## 2024-02-16 DIAGNOSIS — R195 Other fecal abnormalities: Secondary | ICD-10-CM

## 2024-02-16 DIAGNOSIS — R197 Diarrhea, unspecified: Secondary | ICD-10-CM | POA: Diagnosis not present

## 2024-02-16 NOTE — Telephone Encounter (Signed)
 PA approved via cohere Authorization #[id number] DOS: 02/16/2024 - 04/16/2024   Spoke with pt. He stated he can't do the CT tomorrow. I advised [facility] wanted done by Friday and he stated he would have to get back to us  on that. He is aware to call when he can schedule the CT for [facility].

## 2024-02-16 NOTE — Patient Instructions (Signed)
 We will arrange to have a CT scan of your abdomen and pelvis at [facility].  Please have blood work and stool testing at [facility].  We will call you with results and further recommendations.  For now, I recommend that you follow a bland diet, focus on maintaining good hydration.  If any worsening, severe abdominal pain, high fever, please proceed to the emergency room.   [facility], PA-C [facility]

## 2024-02-16 NOTE — Telephone Encounter (Signed)
 noted

## 2024-02-17 ENCOUNTER — Other Ambulatory Visit (HOSPITAL_COMMUNITY): Payer: PRIVATE HEALTH INSURANCE

## 2024-02-18 ENCOUNTER — Ambulatory Visit: Payer: Self-pay | Admitting: Gastroenterology

## 2024-02-18 ENCOUNTER — Ambulatory Visit (HOSPITAL_COMMUNITY)
Admission: RE | Admit: 2024-02-18 | Discharge: 2024-02-18 | Disposition: A | Discharge status: Home / Self Care | Source: Ambulatory Visit | Attending: Gastroenterology

## 2024-02-18 DIAGNOSIS — R1030 Lower abdominal pain, unspecified: Secondary | ICD-10-CM | POA: Insufficient documentation

## 2024-02-18 DIAGNOSIS — R197 Diarrhea, unspecified: Secondary | ICD-10-CM | POA: Diagnosis present

## 2024-02-18 DIAGNOSIS — K921 Melena: Secondary | ICD-10-CM | POA: Insufficient documentation

## 2024-02-18 LAB — POCT I-STAT CREATININE: Creatinine, Ser: 1.2 mg/dL (ref 0.61–1.24)

## 2024-02-18 MED ORDER — IOHEXOL 300 MG/ML  SOLN
100.0000 mL | Freq: Once | INTRAMUSCULAR | Status: AC | PRN
  Administered 2024-02-18: 85 mL via INTRAVENOUS

## 2024-03-23 NOTE — Telephone Encounter (Signed)
 Received office visit note from [facility] surgical specialist in [employer].  Patient had a follow-up office visit 03/22/2024.  Noted patient was admitted to the hospital February 19, 2024 with mild appendicitis with small perforation at the tip.  States patient elected management with antibiotics and decided to forego operative intervention.  At the time of his office visit, patient reported abdominal pain had resolved and he was tolerating a regular diet.  He had no bowel issues or any other concerns.  He was offered interval appendectomy, but patient declined.    Charmaine, please let patient know we received documentation from [facility] surgical specialist stating he was doing well following his episode of acute appendicitis which was treated with antibiotics.  We can follow-up with him daily in 6 months as long as he is not having any other ongoing concerns.  Ladonna: Please place patient on recall for 10-month follow-up with Dr. Cindie.

## 2024-03-26 ENCOUNTER — Other Ambulatory Visit: Payer: Self-pay | Admitting: Internal Medicine

## 2024-03-26 DIAGNOSIS — K219 Gastro-esophageal reflux disease without esophagitis: Secondary | ICD-10-CM

## 2024-08-20 ENCOUNTER — Other Ambulatory Visit: Payer: Self-pay | Admitting: Gastroenterology

## 2024-08-20 DIAGNOSIS — K219 Gastro-esophageal reflux disease without esophagitis: Secondary | ICD-10-CM
